# Patient Record
Sex: Male | Born: 1950 | Race: White | Hispanic: No
Health system: Southern US, Community
[De-identification: ages and names within clinical notes are randomized; demographics above are authoritative.]

## PROBLEM LIST (undated history)

## (undated) DIAGNOSIS — A4181 Sepsis due to Enterococcus: Secondary | ICD-10-CM

## (undated) DIAGNOSIS — N170 Acute kidney failure with tubular necrosis: Secondary | ICD-10-CM

## (undated) DIAGNOSIS — J9621 Acute and chronic respiratory failure with hypoxia: Secondary | ICD-10-CM

## (undated) DIAGNOSIS — I429 Cardiomyopathy, unspecified: Secondary | ICD-10-CM

## (undated) DIAGNOSIS — R6520 Severe sepsis without septic shock: Secondary | ICD-10-CM

## (undated) DIAGNOSIS — I509 Heart failure, unspecified: Secondary | ICD-10-CM

## (undated) DIAGNOSIS — I4892 Unspecified atrial flutter: Secondary | ICD-10-CM

---

## 2019-12-19 ENCOUNTER — Inpatient Hospital Stay
Admission: EM | Admit: 2019-12-19 | Discharge: 2020-02-08 | Disposition: A | Discharge status: Skilled Nursing Facility | Payer: No Typology Code available for payment source | Source: Other Acute Inpatient Hospital | Attending: Internal Medicine | Admitting: Internal Medicine

## 2019-12-19 ENCOUNTER — Other Ambulatory Visit (HOSPITAL_COMMUNITY): Payer: Medicare HMO

## 2019-12-19 DIAGNOSIS — I429 Cardiomyopathy, unspecified: Secondary | ICD-10-CM

## 2019-12-19 DIAGNOSIS — N170 Acute kidney failure with tubular necrosis: Secondary | ICD-10-CM | POA: Diagnosis present

## 2019-12-19 DIAGNOSIS — A4181 Sepsis due to Enterococcus: Secondary | ICD-10-CM | POA: Diagnosis present

## 2019-12-19 DIAGNOSIS — J9690 Respiratory failure, unspecified, unspecified whether with hypoxia or hypercapnia: Secondary | ICD-10-CM

## 2019-12-19 DIAGNOSIS — R6520 Severe sepsis without septic shock: Secondary | ICD-10-CM | POA: Diagnosis present

## 2019-12-19 DIAGNOSIS — R112 Nausea with vomiting, unspecified: Secondary | ICD-10-CM

## 2019-12-19 DIAGNOSIS — Z431 Encounter for attention to gastrostomy: Secondary | ICD-10-CM

## 2019-12-19 DIAGNOSIS — R188 Other ascites: Secondary | ICD-10-CM

## 2019-12-19 DIAGNOSIS — I4892 Unspecified atrial flutter: Secondary | ICD-10-CM | POA: Diagnosis present

## 2019-12-19 DIAGNOSIS — J90 Pleural effusion, not elsewhere classified: Secondary | ICD-10-CM

## 2019-12-19 DIAGNOSIS — J189 Pneumonia, unspecified organism: Secondary | ICD-10-CM

## 2019-12-19 DIAGNOSIS — R609 Edema, unspecified: Secondary | ICD-10-CM

## 2019-12-19 DIAGNOSIS — I509 Heart failure, unspecified: Secondary | ICD-10-CM

## 2019-12-19 DIAGNOSIS — Z930 Tracheostomy status: Secondary | ICD-10-CM

## 2019-12-19 DIAGNOSIS — J984 Other disorders of lung: Secondary | ICD-10-CM

## 2019-12-19 DIAGNOSIS — Z931 Gastrostomy status: Secondary | ICD-10-CM

## 2019-12-19 DIAGNOSIS — T8131XA Disruption of external operation (surgical) wound, not elsewhere classified, initial encounter: Secondary | ICD-10-CM

## 2019-12-19 DIAGNOSIS — J9621 Acute and chronic respiratory failure with hypoxia: Secondary | ICD-10-CM | POA: Diagnosis present

## 2019-12-19 HISTORY — DX: Severe sepsis without septic shock: A41.81

## 2019-12-19 HISTORY — DX: Cardiomyopathy, unspecified: I42.9

## 2019-12-19 HISTORY — DX: Cardiomyopathy, unspecified: I50.9

## 2019-12-19 HISTORY — DX: Severe sepsis without septic shock: R65.20

## 2019-12-19 HISTORY — DX: Acute kidney failure with tubular necrosis: N17.0

## 2019-12-19 HISTORY — DX: Unspecified atrial flutter: I48.92

## 2019-12-19 HISTORY — DX: Acute and chronic respiratory failure with hypoxia: J96.21

## 2019-12-20 ENCOUNTER — Other Ambulatory Visit (HOSPITAL_COMMUNITY): Payer: Medicare HMO

## 2019-12-20 DIAGNOSIS — R6520 Severe sepsis without septic shock: Secondary | ICD-10-CM

## 2019-12-20 DIAGNOSIS — I509 Heart failure, unspecified: Secondary | ICD-10-CM | POA: Diagnosis not present

## 2019-12-20 DIAGNOSIS — A4181 Sepsis due to Enterococcus: Secondary | ICD-10-CM

## 2019-12-20 DIAGNOSIS — N170 Acute kidney failure with tubular necrosis: Secondary | ICD-10-CM | POA: Diagnosis not present

## 2019-12-20 DIAGNOSIS — J9621 Acute and chronic respiratory failure with hypoxia: Secondary | ICD-10-CM

## 2019-12-20 DIAGNOSIS — Z930 Tracheostomy status: Secondary | ICD-10-CM

## 2019-12-20 DIAGNOSIS — I429 Cardiomyopathy, unspecified: Secondary | ICD-10-CM

## 2019-12-20 DIAGNOSIS — I4892 Unspecified atrial flutter: Secondary | ICD-10-CM

## 2019-12-20 LAB — URINALYSIS, ROUTINE W REFLEX MICROSCOPIC
Bilirubin Urine: NEGATIVE
Glucose, UA: NEGATIVE mg/dL
Ketones, ur: 5 mg/dL — AB
Leukocytes,Ua: NEGATIVE
Nitrite: NEGATIVE
Protein, ur: 30 mg/dL — AB
Specific Gravity, Urine: 1.032 — ABNORMAL HIGH (ref 1.005–1.030)
pH: 5 (ref 5.0–8.0)

## 2019-12-20 LAB — COMPREHENSIVE METABOLIC PANEL
ALT: 21 U/L (ref 0–44)
AST: 20 U/L (ref 15–41)
Albumin: 2.4 g/dL — ABNORMAL LOW (ref 3.5–5.0)
Alkaline Phosphatase: 61 U/L (ref 38–126)
Anion gap: 12 (ref 5–15)
BUN: 21 mg/dL (ref 8–23)
CO2: 33 mmol/L — ABNORMAL HIGH (ref 22–32)
Calcium: 9.4 mg/dL (ref 8.9–10.3)
Chloride: 103 mmol/L (ref 98–111)
Creatinine, Ser: 0.83 mg/dL (ref 0.61–1.24)
GFR calc Af Amer: >60 mL/min (ref >60)
GFR calc non Af Amer: >60 mL/min (ref >60)
Glucose, Bld: 139 mg/dL — ABNORMAL HIGH (ref 70–99)
Potassium: 4.3 mmol/L (ref 3.5–5.1)
Sodium: 148 mmol/L — ABNORMAL HIGH (ref 135–145)
Total Bilirubin: 0.7 mg/dL (ref 0.3–1.2)
Total Protein: 5.9 g/dL — ABNORMAL LOW (ref 6.5–8.1)

## 2019-12-20 LAB — CBC WITH DIFFERENTIAL/PLATELET
Abs Immature Granulocytes: 0.04 10*3/uL (ref 0.00–0.07)
Basophils Absolute: 0 10*3/uL (ref 0.0–0.1)
Basophils Relative: 0 %
Eosinophils Absolute: 0 10*3/uL (ref 0.0–0.5)
Eosinophils Relative: 1 %
HCT: 35.6 % — ABNORMAL LOW (ref 39.0–52.0)
Hemoglobin: 10.2 g/dL — ABNORMAL LOW (ref 13.0–17.0)
Immature Granulocytes: 1 %
Lymphocytes Relative: 9 %
Lymphs Abs: 0.8 10*3/uL (ref 0.7–4.0)
MCH: 28.3 pg (ref 26.0–34.0)
MCHC: 28.7 g/dL — ABNORMAL LOW (ref 30.0–36.0)
MCV: 98.9 fL (ref 80.0–100.0)
Monocytes Absolute: 0.7 10*3/uL (ref 0.1–1.0)
Monocytes Relative: 8 %
Neutro Abs: 7.1 10*3/uL (ref 1.7–7.7)
Neutrophils Relative %: 81 %
Platelets: 297 10*3/uL (ref 150–400)
RBC: 3.6 MIL/uL — ABNORMAL LOW (ref 4.22–5.81)
RDW: 15.1 % (ref 11.5–15.5)
WBC: 8.7 10*3/uL (ref 4.0–10.5)
nRBC: 0 % (ref 0.0–0.2)

## 2019-12-20 LAB — MAGNESIUM: Magnesium: 2.2 mg/dL (ref 1.7–2.4)

## 2019-12-20 LAB — TSH: TSH: 0.383 u[IU]/mL (ref 0.350–4.500)

## 2019-12-20 LAB — HEMOGLOBIN A1C
Hgb A1c MFr Bld: 6.5 % — ABNORMAL HIGH (ref 4.8–5.6)
Mean Plasma Glucose: 139.85 mg/dL

## 2019-12-20 LAB — PHOSPHORUS: Phosphorus: 2.5 mg/dL (ref 2.5–4.6)

## 2019-12-20 MED ORDER — IOHEXOL 300 MG/ML  SOLN
50.0000 mL | Freq: Once | INTRAMUSCULAR | Status: AC | PRN
  Administered 2019-12-20: 02:00:00 50 mL

## 2019-12-20 MED ORDER — IOHEXOL 300 MG/ML  SOLN
50.0000 mL | Freq: Once | INTRAMUSCULAR | Status: AC | PRN
  Administered 2019-12-20: 50 mL

## 2019-12-20 NOTE — Consult Note (Signed)
Pulmonary Critical Care Medicine [facility]  PULMONARY SERVICE  Date of Service: 12/20/2019  PULMONARY CRITICAL CARE CONSULT   Sean Porter  NAT:[id number]  DOB: 06-02-1951   DOA: 12/19/2019  Referring Physician: Carron Curie, MD  HPI: Sean Porter is a 69 y.o. male seen for follow up of Acute on Chronic Respiratory Failure.  Patient has multiple medical problems including congestive heart failure diabetes mellitus hypertension smoker who presented to the hospital because of a diagnosis of cholecystitis.  Patient was evaluated at that time was noted to have free air went for exploratory laparotomy and at that time was found to a gangrenous gallbladder.  Also patient had pancreatitis.  The pancreas was apparently necrotic.  Patient was in sepsis and shock and patient required pressors for this.  Patient was treated with vancomycin as well as meropenem.  Follow-up cultures were negative and he had a wound which grew Enterococcus faecalis.  Subsequently patient was treated with Zosyn.  Patient was not able to come off the ventilator eventually ended up with a PEG as well as a tracheostomy.  Now presents to our facility for further management and weaning at this time patient is on T collar  Review of Systems:  ROS performed and is unremarkable other than noted above.  Past medical history: CHF Diabetes mellitus Hypertension Smoker Respiratory failure Sepsis Pancreatitis Atrial flutter Cholecystitis CAD chest Acute renal failure  Past surgical history: Tracheostomy PEG Cholecystectomy Exploratory laparotomy  Social history: Positive for tobacco use Unknown alcohol or drug abuse  Family history: Noncontributory to the present illness  Medications: Reviewed on Rounds  Physical Exam:  Vitals: Temperature is 97.7 pulse 95 respiratory 22 blood pressure is 165/72 saturations 93%  Ventilator Settings off the ventilator right now on T collar  . General:  Comfortable at this time . Eyes: Grossly normal lids, irises & conjunctiva . ENT: grossly tongue is normal . Neck: no obvious mass . Cardiovascular: S1-S2 normal no gallop or rub . Respiratory: No rhonchi no rales are noted . Abdomen: Soft and nontender . Skin: no rash seen on limited exam . Musculoskeletal: not rigid . Psychiatric:unable to assess . Neurologic: no seizure no involuntary movements         Labs on Admission:  Basic Metabolic Panel: Recent Labs  Lab 12/20/19 0811  NA 148*  K 4.3  CL 103  CO2 33*  GLUCOSE 139*  BUN 21  CREATININE 0.83  CALCIUM 9.4  MG 2.2  PHOS 2.5    No results for input(s): PHART, PCO2ART, PO2ART, HCO3, O2SAT in the last 168 hours.  Liver Function Tests: Recent Labs  Lab 12/20/19 0811  AST 20  ALT 21  ALKPHOS 61  BILITOT 0.7  PROT 5.9*  ALBUMIN 2.4*   No results for input(s): LIPASE, AMYLASE in the last 168 hours. No results for input(s): AMMONIA in the last 168 hours.  CBC: Recent Labs  Lab 12/20/19 0811  WBC 8.7  NEUTROABS 7.1  HGB 10.2*  HCT 35.6*  MCV 98.9  PLT 297    Cardiac Enzymes: No results for input(s): CKTOTAL, CKMB, CKMBINDEX, TROPONINI in the last 168 hours.  BNP (last 3 results) No results for input(s): BNP in the last 8760 hours.  ProBNP (last 3 results) No results for input(s): PROBNP in the last 8760 hours.   Radiological Exams on Admission: DG ABDOMEN PEG TUBE LOCATION  Result Date: 12/20/2019 CLINICAL DATA:  Peg placement. EXAM: ABDOMEN - 1 VIEW COMPARISON:  None. FINDINGS: 00:16 hour: AP  portable supine image #1: Technologist injected 50 cc Omnipaque 300 through indwelling catheter. After injection, nurse stated that catheter that was injected was a drainage catheter rather than the gastric catheter. Contrast opacifies the catheter tubing and what appears to be the stomach. Additional catheter tubing is seen in the upper abdomen. Possible drain projects over the left lower quadrant as well.  This catheter is set to gravity drainage. 0208 hour: AP portable supine images # 2 and 3: Additional 50 cc Omnipaque 300 was injected through a different catheter which is more cranial, reportedly the PEG. Contrast from this injection opacifies the distal duodenum and proximal jejunum. Previous injected contrast has cleared. IMPRESSION: 1. Initial contrast injected in what was thought to be gastric tube was reportedly injected through a drainage catheter. This injection opacified would appears to be the stomach. Recommend correlation with patient's surgical and procedural history to identify what type of catheter or drain this is. 2. Second contrast injection through catheter more superiorly that was reportedly the PEG opacifies the distal duodenum and jejunum. No evidence of extravasation or leak. The previously injected contrast has apparently cleared. This catheter follows the course of the duodenum and jejunum, may represent a G-J tube. Correlation with clinical history again recommended. Electronically Signed   By: Keith Rake M.D.   On: 12/20/2019 02:41   DG Chest Port 1 View  Result Date: 12/20/2019 CLINICAL DATA:  Respiratory failure. Trach patient. EXAM: PORTABLE CHEST 1 VIEW COMPARISON:  None. FINDINGS: Endotracheal or tracheostomy tube tip 3.1 cm from the carina. Heart is enlarged. Heterogeneous bilateral lung opacities, most prominent in the bases. No pneumothorax or large pleural effusion. IMPRESSION: 1. Endotracheal or tracheostomy tube tip 3.1 cm from the carina. 2. Heterogeneous bilateral lung opacities, most prominent in the bases. Findings may be related to pneumonia including atypical viral organisms or pulmonary edema. 3. Cardiomegaly. Electronically Signed   By: Keith Rake M.D.   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 Acute on chronic respiratory failure with hypoxia right now patient is actually weaning well on the T collar with plan is going to be to continue with T collar trials as tolerated continue with secretion management and close pulmonary toilet. 2. Chronic congestive heart failure unspecified patient right now appears to be compensated advised chest x-ray was showing possible pulmonary edema along with the cardiomegaly.  Patient needs aggressive diuresis we will continue to monitor 3. Chronic atrial flutter rate now rate controlled we will continue to follow. 4. Severe sepsis this has resolved patient is apparently on Zosyn for a total of 28 days should be completing this soon 5. Acute renal failure this has resolved we will continue to monitor labs  I have personally seen and evaluated the patient, evaluated laboratory and imaging results, formulated the assessment and plan and placed orders. The Patient requires high complexity decision making with multiple systems involvement.  Case was discussed on Rounds with the Respiratory Therapy Director and the Respiratory staff Time Spent 65minutes  Jizel Cheeks A Jacorie Ernsberger, MD [facility] Pulmonary Critical Care Medicine Sleep Medicine

## 2019-12-21 ENCOUNTER — Other Ambulatory Visit (HOSPITAL_COMMUNITY): Payer: Medicare HMO

## 2019-12-21 ENCOUNTER — Encounter: Payer: Self-pay | Admitting: Internal Medicine

## 2019-12-21 DIAGNOSIS — N170 Acute kidney failure with tubular necrosis: Secondary | ICD-10-CM | POA: Diagnosis present

## 2019-12-21 DIAGNOSIS — J9621 Acute and chronic respiratory failure with hypoxia: Secondary | ICD-10-CM | POA: Diagnosis not present

## 2019-12-21 DIAGNOSIS — I4892 Unspecified atrial flutter: Secondary | ICD-10-CM | POA: Diagnosis not present

## 2019-12-21 DIAGNOSIS — I509 Heart failure, unspecified: Secondary | ICD-10-CM

## 2019-12-21 DIAGNOSIS — A4181 Sepsis due to Enterococcus: Secondary | ICD-10-CM | POA: Diagnosis present

## 2019-12-21 LAB — BASIC METABOLIC PANEL
Anion gap: 7 (ref 5–15)
BUN: 18 mg/dL (ref 8–23)
CO2: 33 mmol/L — ABNORMAL HIGH (ref 22–32)
Calcium: 9.2 mg/dL (ref 8.9–10.3)
Chloride: 105 mmol/L (ref 98–111)
Creatinine, Ser: 0.85 mg/dL (ref 0.61–1.24)
GFR calc Af Amer: >60 mL/min (ref >60)
GFR calc non Af Amer: >60 mL/min (ref >60)
Glucose, Bld: 210 mg/dL — ABNORMAL HIGH (ref 70–99)
Potassium: 4.1 mmol/L (ref 3.5–5.1)
Sodium: 145 mmol/L (ref 135–145)

## 2019-12-21 LAB — CBC
HCT: 34.7 % — ABNORMAL LOW (ref 39.0–52.0)
Hemoglobin: 10.4 g/dL — ABNORMAL LOW (ref 13.0–17.0)
MCH: 28.6 pg (ref 26.0–34.0)
MCHC: 30 g/dL (ref 30.0–36.0)
MCV: 95.3 fL (ref 80.0–100.0)
Platelets: 302 10*3/uL (ref 150–400)
RBC: 3.64 MIL/uL — ABNORMAL LOW (ref 4.22–5.81)
RDW: 15.2 % (ref 11.5–15.5)
WBC: 11.2 10*3/uL — ABNORMAL HIGH (ref 4.0–10.5)
nRBC: 0 % (ref 0.0–0.2)

## 2019-12-21 LAB — PHOSPHORUS: Phosphorus: 1.9 mg/dL — ABNORMAL LOW (ref 2.5–4.6)

## 2019-12-21 LAB — MAGNESIUM: Magnesium: 2.1 mg/dL (ref 1.7–2.4)

## 2019-12-21 NOTE — Consult Note (Signed)
Infectious Disease Consultation   Cristopher Ciccarelli  IPJ:[id number]  DOB: [birth year]  DOA: 12/19/2019  Requesting physician: Dr. Laren Everts  Reason for consultation: Antibiotic recommendations   History of Present Illness: Sean Porter is an 69 y.o. male with history of congestive heart failure, diabetes mellitus, hypertension, tobacco abuse who initially presented to [facility] on 11/19/2019 with abdominal pain.  Was noted to be hypotensive with free air noted on the abdominal imaging.  He had emergent exploratory laparotomy where he was found to have gangrenous cholecystitis with pancreatitis and infected pancreatic bed and necrosis.  He had the gallbladder removed and the necrotic pancreas debrided.  Initially he was in septic shock required pressors for shock and remained ventilated postoperatively.  On postoperative day 1 he developed atrial flutter and required amiodarone.  He was weaned from pressors on day 5 but intermittently required pressors after that.  He was treated with antibiotic therapy with meropenem and vancomycin for 5 days.  His blood cultures were negative but his wound showed Enterococcus faecalis and he was switched to Zosyn.  Previously he had 2 JP drains and an [place] drain with wound VAC.  He currently has midline abdominal dressing with drain in place. He underwent PEG tube and trach placement on 12/04/2019 for prolonged respiratory failure and need for nutrition.  Due to his complex medical problems he was transferred to [facility] for further management.  He was weaned from his TPN.  Tentative plan is to treat him with total 4 weeks of Zosyn.  He is nonverbal but able to nod when asked questions but seems confused.  Per the nursing staff he had 2 episodes of vomiting today.   Review of Systems:  He is nonverbal at this time.  Unable to obtain review of systems as the patient is confused.   Past Medical History: Past Medical History:  Diagnosis  Date  . Acute on chronic respiratory failure with hypoxia ([place])   . Acute renal failure due to tubular necrosis ([place])   . Chronic atrial flutter ([place])   . Congestive heart failure due to cardiomyopathy ([place])   . Severe sepsis with acute organ dysfunction due to Enterococcus species (HCC)   Diabetes mellitus, hypertension, coronary artery disease, acute renal failure  Past Surgical History: Tracheostomy, PEG tube placement, cholecystectomy, recent exploratory laparotomy  Allergies: No known drug allergies  Social History: History of tobacco abuse, unknown if history of alcohol abuse.  Family History: Unable to obtain at this time.   Physical Exam: Vitals: Temperature 97.5, pulse 105, respiratory rate 23, blood pressure 139/65, pulse oximetry 97% Constitutional: Ill-appearing male, awake, confused Head/Eyes: Atraumatic, normocephalic, PERLA  ENMT: external ears and nose appear normal, normal hearing, Lips appears normal, unable to visualize the oropharynx Neck: Has trach in place CVS: S1-S2, no murmur  Respiratory: Coarse breath sounds, rhonchi, no wheezing Abdomen: Distended, has dressing in place, abdominal drain in place, PEG tube, bowel sounds heard Musculoskeletal: No edema Neuro: He is confused, able to move his extremities but has severe debility with generalized weakness Psych: stable mood and affect, confused Skin: no rashes  Data reviewed:  I have personally reviewed following labs and imaging studies Labs:  CBC: Recent Labs  Lab 12/20/19 0811 12/21/19 0530  WBC 8.7 11.2*  NEUTROABS 7.1  --   HGB 10.2* 10.4*  HCT 35.6* 34.7*  MCV 98.9 95.3  PLT 297 734    Basic Metabolic Panel: Recent Labs  Lab 12/20/19 0811 12/21/19 0530  NA 148* 145  K 4.3 4.1  CL 103 105  CO2 33* 33*  GLUCOSE 139* 210*  BUN 21 18  CREATININE 0.83 0.85  CALCIUM 9.4 9.2  MG 2.2 2.1  PHOS 2.5 1.9*   GFR CrCl cannot be calculated (Unknown ideal weight.). Liver Function  Tests: Recent Labs  Lab 12/20/19 0811  AST 20  ALT 21  ALKPHOS 61  BILITOT 0.7  PROT 5.9*  ALBUMIN 2.4*   No results for input(s): LIPASE, AMYLASE in the last 168 hours. No results for input(s): AMMONIA in the last 168 hours. Coagulation profile No results for input(s): INR, PROTIME in the last 168 hours.  Cardiac Enzymes: No results for input(s): CKTOTAL, CKMB, CKMBINDEX, TROPONINI in the last 168 hours. BNP: Invalid input(s): POCBNP CBG: No results for input(s): GLUCAP in the last 168 hours. D-Dimer No results for input(s): DDIMER in the last 72 hours. Hgb A1c Recent Labs    12/20/19 0811  HGBA1C 6.5*   Lipid Profile No results for input(s): CHOL, HDL, LDLCALC, TRIG, CHOLHDL, LDLDIRECT in the last 72 hours. Thyroid function studies Recent Labs    12/20/19 0811  TSH 0.383   Anemia work up No results for input(s): VITAMINB12, FOLATE, FERRITIN, TIBC, IRON, RETICCTPCT in the last 72 hours. Urinalysis    Component Value Date/Time   COLORURINE YELLOW 12/20/2019 1820   APPEARANCEUR CLOUDY (A) 12/20/2019 1820   LABSPEC 1.032 (H) 12/20/2019 1820   PHURINE 5.0 12/20/2019 1820   GLUCOSEU NEGATIVE 12/20/2019 1820   HGBUR MODERATE (A) 12/20/2019 1820   BILIRUBINUR NEGATIVE 12/20/2019 1820   KETONESUR 5 (A) 12/20/2019 1820   PROTEINUR 30 (A) 12/20/2019 1820   NITRITE NEGATIVE 12/20/2019 1820   LEUKOCYTESUR NEGATIVE 12/20/2019 1820     Microbiology Recent Results (from the past 240 hour(s))  Culture, respiratory (non-expectorated)     Status: None (Preliminary result)   Collection Time: 12/20/19  9:44 AM   Specimen: Tracheal Aspirate; Respiratory  Result Value Ref Range Status   Specimen Description TRACHEAL ASPIRATE  Final   Special Requests NONE  Final   Gram Stain   Final    RARE WBC PRESENT, PREDOMINANTLY PMN FEW GRAM POSITIVE COCCI IN CLUSTERS FEW GRAM NEGATIVE RODS    Culture   Final    MODERATE STENOTROPHOMONAS MALTOPHILIA CULTURE REINCUBATED FOR  BETTER GROWTH Performed at [facility], [address] [place], [place] [address]    Report Status PENDING  Incomplete       Inpatient Medications:   Scheduled Meds: Continuous Infusions:   Radiological Exams on Admission: DG Abd 1 View  Result Date: 12/21/2019 CLINICAL DATA:  Nausea and vomiting. EXAM: ABDOMEN - 1 VIEW COMPARISON:  December 19, 2018 FINDINGS: There is a PEG tube projects over the left upper quadrant of the abdomen, unchanged since yesterday. No free air, portal venous gas, or pneumatosis or seen on limited supine imaging. No evidence of bowel obstruction. No other acute abnormalities. IMPRESSION: No acute abnormalities identified. Electronically Signed   By: Gerome Sam III M.D   On: 12/21/2019 14:52   DG ABDOMEN PEG TUBE LOCATION  Result Date: 12/20/2019 CLINICAL DATA:  Peg placement. EXAM: ABDOMEN - 1 VIEW COMPARISON:  None. FINDINGS: 00:16 hour: AP portable supine image #1: Technologist injected 50 cc Omnipaque 300 through indwelling catheter. After injection, nurse stated that catheter that was injected was a drainage catheter rather than the gastric catheter. Contrast opacifies the catheter tubing and what appears to be the stomach. Additional catheter tubing is seen in the  upper abdomen. Possible drain projects over the left lower quadrant as well. This catheter is set to gravity drainage. 0208 hour: AP portable supine images # 2 and 3: Additional 50 cc Omnipaque 300 was injected through a different catheter which is more cranial, reportedly the PEG. Contrast from this injection opacifies the distal duodenum and proximal jejunum. Previous injected contrast has cleared. IMPRESSION: 1. Initial contrast injected in what was thought to be gastric tube was reportedly injected through a drainage catheter. This injection opacified would appears to be the stomach. Recommend correlation with patient's surgical and procedural history to identify what type of catheter or  drain this is. 2. Second contrast injection through catheter more superiorly that was reportedly the PEG opacifies the distal duodenum and jejunum. No evidence of extravasation or leak. The previously injected contrast has apparently cleared. This catheter follows the course of the duodenum and jejunum, may represent a G-J tube. Correlation with clinical history again recommended. Electronically Signed   By: Narda Rutherford M.D.   On: 12/20/2019 02:41   DG CHEST PORT 1 VIEW  Result Date: 12/21/2019 CLINICAL DATA:  Pneumonia in tracheostomy tube EXAM: PORTABLE CHEST 1 VIEW COMPARISON:  Yesterday FINDINGS: Tracheostomy tube is present and well seated. Low volume chest with indistinct infiltrates at the bases. Lung volumes are mildly improved. Vague rounded density over the right lower lung may relate to the anterior fourth rib. Attention on follow-up. Cardiomegaly. IMPRESSION: 1. Well seated tracheostomy tube. 2. Mildly improved lung volumes but unchanged basilar infiltrates. Electronically Signed   By: Marnee Spring M.D.   On: 12/21/2019 07:09   DG Chest Port 1 View  Result Date: 12/20/2019 CLINICAL DATA:  Respiratory failure. Trach patient. EXAM: PORTABLE CHEST 1 VIEW COMPARISON:  None. FINDINGS: Endotracheal or tracheostomy tube tip 3.1 cm from the carina. Heart is enlarged. Heterogeneous bilateral lung opacities, most prominent in the bases. No pneumothorax or large pleural effusion. IMPRESSION: 1. Endotracheal or tracheostomy tube tip 3.1 cm from the carina. 2. Heterogeneous bilateral lung opacities, most prominent in the bases. Findings may be related to pneumonia including atypical viral organisms or pulmonary edema. 3. Cardiomegaly. Electronically Signed   By: Narda Rutherford M.D.   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  Postoperatively he had respiratory failure.  Currently has trach in place.  On 28% FiO2.  He is still having increased secretions.  He is at high risk for aspiration and worsening kidney failure secondary to aspiration pneumonia.  Currently on Zosyn for necrotic pancreatitis secondary to ruptured gangrenous gallbladder.  If his respiratory status worsens would recommend repeat respiratory imaging preferably chest CT without contrast to better evaluate and also send for respiratory cultures.  Follow-up on the cultures and treat accordingly.  Continue aggressive pulmonary toileting.  Severe sepsis: Patient previously had septic shock at the outside facility secondary to ruptured gallbladder from gangrenous cholecystitis and pancreatic necrosis.  Currently on IV Zosyn.  Cultures showed Enterococcus species.  Tentative plan is to treat for total of  28 days.  He has 4 more days to complete the duration of 4 weeks of treatment.  However, he is at very high risk for recurrent sepsis.  If he starts having any worsening fever, leukocytosis would recommend to send for repeat pancultures and also repeat abdominal imaging to evaluate.  Gangrenous cholecystitis with pancreatic necrosis: Status post expiratory laparotomy with drains in place.  Continue IV antibiotics as mentioned above.  Continue local wound care.  He has a postoperative abdominal wound with dressing in place.  Dysphagia: Due to his dysphagia he is high risk for aspiration and worsening respiratory failure  secondary to aspiration pneumonia.  Vomiting: Per the nursing staff he had 2 episodes of vomiting.  Does not have much residual on his tube feeds.  KUB at this time did not show any acute findings.  However, he is very high risk for ileus.  If he continues to have vomiting would recommend to hold the tube feeds and repeat abdominal imaging.  Further management per the primary team.  Diabetes mellitus type 2: Blood glucose on the higher side.  Continue to monitor Accu-Cheks, further management of diabetes per the primary team.  Chronically malnutrition: On tube feeds.  Further management per primary team.  Congestive heart failure: At this time appears to be compensated.  Continue medications and management per primary team.  However, he is very high risk for decompensated CHF.  Monitor closely.  Chronic atrial flutter: Continue medications per primary team.  Currently rate controlled.  Acute renal failure: Patient at the outside facility also had acute renal failure.  At this time renal function appears to be stable.  However, he is high risk for worsening renal failure.  Continue to monitor BUN/creatinine closely especially while on antibiotics and adjust dose accordingly.  Avoid nephrotoxic medication.  Due to his complex medical problems he is high risk for worsening and decompensation.  Plan of care discussed with the primary team.  Thank you for this consultation.    Vonzella Nipple M.D. 12/21/2019, 5:38 PM

## 2019-12-21 NOTE — Progress Notes (Signed)
Pulmonary Critical Care Medicine [facility] [place]   PULMONARY CRITICAL CARE SERVICE  PROGRESS NOTE  Date of Service: 12/21/2019  Sean Porter  ACZ:[id number]  DOB: July 01, 1951   DOA: 12/19/2019  Referring Physician: Carron Curie, MD  HPI: Sean Porter is a 69 y.o. male seen for follow up of Acute on Chronic Respiratory Failure.  Patient is on T collar today has thick copious amounts of secretions still.  Right now is requiring 28% FiO2 and frequent suctioning  Medications: Reviewed on Rounds  Physical Exam:  Vitals: Temperature is 98.1 pulse 95 respiratory 24 blood pressure is 151/69 saturations 94%  Ventilator Settings on T collar with an FiO2 28%  . General: Comfortable at this time . Eyes: Grossly normal lids, irises & conjunctiva . ENT: grossly tongue is normal . Neck: no obvious mass . Cardiovascular: S1 S2 normal no gallop . Respiratory: Coarse breath sounds with a few scattered rhonchi . Abdomen: soft . Skin: no rash seen on limited exam . Musculoskeletal: not rigid . Psychiatric:unable to assess . Neurologic: no seizure no involuntary movements         Lab Data:   Basic Metabolic Panel: Recent Labs  Lab 12/20/19 0811 12/21/19 0530  NA 148* 145  K 4.3 4.1  CL 103 105  CO2 33* 33*  GLUCOSE 139* 210*  BUN 21 18  CREATININE 0.83 0.85  CALCIUM 9.4 9.2  MG 2.2 2.1  PHOS 2.5 1.9*    ABG: No results for input(s): PHART, PCO2ART, PO2ART, HCO3, O2SAT in the last 168 hours.  Liver Function Tests: Recent Labs  Lab 12/20/19 0811  AST 20  ALT 21  ALKPHOS 61  BILITOT 0.7  PROT 5.9*  ALBUMIN 2.4*   No results for input(s): LIPASE, AMYLASE in the last 168 hours. No results for input(s): AMMONIA in the last 168 hours.  CBC: Recent Labs  Lab 12/20/19 0811 12/21/19 0530  WBC 8.7 11.2*  NEUTROABS 7.1  --   HGB 10.2* 10.4*  HCT 35.6* 34.7*  MCV 98.9 95.3  PLT 297 302    Cardiac Enzymes: No results for input(s): CKTOTAL, CKMB,  CKMBINDEX, TROPONINI in the last 168 hours.  BNP (last 3 results) No results for input(s): BNP in the last 8760 hours.  ProBNP (last 3 results) No results for input(s): PROBNP in the last 8760 hours.  Radiological Exams: DG ABDOMEN PEG TUBE LOCATION  Result Date: 12/20/2019 CLINICAL DATA:  Peg placement. EXAM: ABDOMEN - 1 VIEW COMPARISON:  None. FINDINGS: 00:16 hour: AP portable supine image #1: Technologist injected 50 cc Omnipaque 300 through indwelling catheter. After injection, nurse stated that catheter that was injected was a drainage catheter rather than the gastric catheter. Contrast opacifies the catheter tubing and what appears to be the stomach. Additional catheter tubing is seen in the upper abdomen. Possible drain projects over the left lower quadrant as well. This catheter is set to gravity drainage. 0208 hour: AP portable supine images # 2 and 3: Additional 50 cc Omnipaque 300 was injected through a different catheter which is more cranial, reportedly the PEG. Contrast from this injection opacifies the distal duodenum and proximal jejunum. Previous injected contrast has cleared. IMPRESSION: 1. Initial contrast injected in what was thought to be gastric tube was reportedly injected through a drainage catheter. This injection opacified would appears to be the stomach. Recommend correlation with patient's surgical and procedural history to identify what type of catheter or drain this is. 2. Second contrast injection through catheter more superiorly that  was reportedly the PEG opacifies the distal duodenum and jejunum. No evidence of extravasation or leak. The previously injected contrast has apparently cleared. This catheter follows the course of the duodenum and jejunum, may represent a G-J tube. Correlation with clinical history again recommended. Electronically Signed   By: Keith Rake M.D.   On: 12/20/2019 02:41   DG CHEST PORT 1 VIEW  Result Date: 12/21/2019 CLINICAL DATA:   Pneumonia in tracheostomy tube EXAM: PORTABLE CHEST 1 VIEW COMPARISON:  Yesterday FINDINGS: Tracheostomy tube is present and well seated. Low volume chest with indistinct infiltrates at the bases. Lung volumes are mildly improved. Vague rounded density over the right lower lung may relate to the anterior fourth rib. Attention on follow-up. Cardiomegaly. IMPRESSION: 1. Well seated tracheostomy tube. 2. Mildly improved lung volumes but unchanged basilar infiltrates. Electronically Signed   By: Monte Fantasia M.D.   On: 12/21/2019 07:09   DG Chest Port 1 View  Result Date: 12/20/2019 CLINICAL DATA:  Respiratory failure. Trach patient. EXAM: PORTABLE CHEST 1 VIEW COMPARISON:  None. FINDINGS: Endotracheal or tracheostomy tube tip 3.1 cm from the carina. Heart is enlarged. Heterogeneous bilateral lung opacities, most prominent in the bases. No pneumothorax or large pleural effusion. IMPRESSION: 1. Endotracheal or tracheostomy tube tip 3.1 cm from the carina. 2. Heterogeneous bilateral lung opacities, most prominent in the bases. Findings may be related to pneumonia including atypical viral organisms or pulmonary edema. 3. Cardiomegaly. Electronically Signed   By: Keith Rake M.D.   On: 12/20/2019 01:15    Assessment/Plan Active Problems:   Acute on chronic respiratory failure with hypoxia (HCC)   Congestive heart failure due to cardiomyopathy (HCC)   Chronic atrial flutter (HCC)   Severe sepsis with acute organ dysfunction due to Enterococcus species ([place])   Acute renal failure due to tubular necrosis ([employer])   1. Acute on chronic respiratory failure with hypoxia continue with T collar for now secretions are limiting [place] as far as being able to proceed more aggressively 2. Congestive heart failure due to cardiomyopathy appears to be compensated diuretics as tolerated 3. Chronic atrial flutter rate controlled 4. Severe sepsis resolved 5. Acute renal failure resolved we will continue to monitor labs  closely.   I have personally seen and evaluated the patient, evaluated laboratory and imaging results, formulated the assessment and plan and placed orders. The Patient requires high complexity decision making with multiple systems involvement.  Rounds were done with the Respiratory Therapy Director and Staff therapists and discussed with nursing staff also.  Time 35 minutes  Allyne Gee, MD [facility] Pulmonary Critical Care Medicine Sleep Medicine

## 2019-12-22 ENCOUNTER — Other Ambulatory Visit (HOSPITAL_COMMUNITY): Payer: Medicare HMO

## 2019-12-22 DIAGNOSIS — I4892 Unspecified atrial flutter: Secondary | ICD-10-CM | POA: Diagnosis not present

## 2019-12-22 DIAGNOSIS — N170 Acute kidney failure with tubular necrosis: Secondary | ICD-10-CM | POA: Diagnosis not present

## 2019-12-22 DIAGNOSIS — J9621 Acute and chronic respiratory failure with hypoxia: Secondary | ICD-10-CM | POA: Diagnosis not present

## 2019-12-22 DIAGNOSIS — I509 Heart failure, unspecified: Secondary | ICD-10-CM | POA: Diagnosis not present

## 2019-12-22 NOTE — Progress Notes (Addendum)
Pulmonary Critical Care Medicine [place]   PULMONARY CRITICAL CARE SERVICE  PROGRESS NOTE  Date of Service: 12/22/2019  Sean Porter  DXI:[id number]  DOB: June 26, 1951   DOA: 12/19/2019  Referring Physician: Merton Border, MD  HPI: Sean Porter is a 69 y.o. male seen for follow up of Acute on Chronic Respiratory Failure.  Patient is on T collar has been having copious thick amount of secretions.  Patient has been requiring frequent suctioning so been holding off on capping right now  Medications: Reviewed on Rounds  Physical Exam:  Vitals: Temperature is 98.0 pulse 86 respiratory 18 blood pressure is 141/76 saturations 97%  Ventilator Settings on T collar with an FiO2 28%  . General: Comfortable at this time . Eyes: Grossly normal lids, irises & conjunctiva . ENT: grossly tongue is normal . Neck: no obvious mass . Cardiovascular: S1 S2 normal no gallop . Respiratory: Scattered rhonchi expansion is equal at this time . Abdomen: soft . Skin: no rash seen on limited exam . Musculoskeletal: not rigid . Psychiatric:unable to assess . Neurologic: no seizure no involuntary movements         Lab Data:   Basic Metabolic Panel: Recent Labs  Lab 12/20/19 0811 12/21/19 0530  NA 148* 145  K 4.3 4.1  CL 103 105  CO2 33* 33*  GLUCOSE 139* 210*  BUN 21 18  CREATININE 0.83 0.85  CALCIUM 9.4 9.2  MG 2.2 2.1  PHOS 2.5 1.9*    ABG: No results for input(s): PHART, PCO2ART, PO2ART, HCO3, O2SAT in the last 168 hours.  Liver Function Tests: Recent Labs  Lab 12/20/19 0811  AST 20  ALT 21  ALKPHOS 61  BILITOT 0.7  PROT 5.9*  ALBUMIN 2.4*   No results for input(s): LIPASE, AMYLASE in the last 168 hours. No results for input(s): AMMONIA in the last 168 hours.  CBC: Recent Labs  Lab 12/20/19 0811 12/21/19 0530  WBC 8.7 11.2*  NEUTROABS 7.1  --   HGB 10.2* 10.4*  HCT 35.6* 34.7*  MCV 98.9 95.3  PLT 297 302    Cardiac Enzymes: No results for  input(s): CKTOTAL, CKMB, CKMBINDEX, TROPONINI in the last 168 hours.  BNP (last 3 results) No results for input(s): BNP in the last 8760 hours.  ProBNP (last 3 results) No results for input(s): PROBNP in the last 8760 hours.  Radiological Exams: DG Abd 1 View  Result Date: 12/21/2019 CLINICAL DATA:  Nausea and vomiting. EXAM: ABDOMEN - 1 VIEW COMPARISON:  December 19, 2018 FINDINGS: There is a PEG tube projects over the left upper quadrant of the abdomen, unchanged since yesterday. No free air, portal venous gas, or pneumatosis or seen on limited supine imaging. No evidence of bowel obstruction. No other acute abnormalities. IMPRESSION: No acute abnormalities identified. Electronically Signed   By: Dorise Bullion III M.D   On: 12/21/2019 14:52   DG CHEST PORT 1 VIEW  Result Date: 12/21/2019 CLINICAL DATA:  Pneumonia in tracheostomy tube EXAM: PORTABLE CHEST 1 VIEW COMPARISON:  Yesterday FINDINGS: Tracheostomy tube is present and well seated. Low volume chest with indistinct infiltrates at the bases. Lung volumes are mildly improved. Vague rounded density over the right lower lung may relate to the anterior fourth rib. Attention on follow-up. Cardiomegaly. IMPRESSION: 1. Well seated tracheostomy tube. 2. Mildly improved lung volumes but unchanged basilar infiltrates. Electronically Signed   By: Monte Fantasia M.D.   On: 12/21/2019 07:09    Assessment/Plan Active Problems:   Acute  on chronic respiratory failure with hypoxia (HCC)   Congestive heart failure due to cardiomyopathy (HCC)   Chronic atrial flutter (HCC)   Severe sepsis with acute organ dysfunction due to Enterococcus species (HCC)   Acute renal failure due to tubular necrosis (HCC)   1. Acute on chronic respiratory failure with hypoxia plan is to continue with T collar trials titrate oxygen continue pulmonary toilet.  Patient is supposed to have a tracheostomy changed out today hopefully this will help with secretions  also 2. Chronic atrial flutter rate is controlled we will continue with supportive care 3. Severe sepsis patient is on management per infectious disease 4. Congestive heart failure right now is compensated we will continue to monitor. 5. Acute renal failure following labs closely   I have personally seen and evaluated the patient, evaluated laboratory and imaging results, formulated the assessment and plan and placed orders. The Patient requires high complexity decision making with multiple systems involvement.  Rounds were done with the Respiratory Therapy Director and Staff therapists and discussed with nursing staff also.  Time 35 minutes  Yevonne Pax, MD [facility] Pulmonary Critical Care Medicine Sleep Medicine

## 2019-12-22 NOTE — Progress Notes (Signed)
  Request for drain placement and replace GJ tube.  Images reviewed by Dr. Miles Costain.  No need for drain placement at this time.  Will exchange GJ on Monday.  Ok to use J portion of tube. Do NOT use G portion of tube.  Jerry Caras Jarica Plass PA-C 12/22/2019 3:42 PM

## 2019-12-23 DIAGNOSIS — I4892 Unspecified atrial flutter: Secondary | ICD-10-CM | POA: Diagnosis not present

## 2019-12-23 DIAGNOSIS — N170 Acute kidney failure with tubular necrosis: Secondary | ICD-10-CM | POA: Diagnosis not present

## 2019-12-23 DIAGNOSIS — I509 Heart failure, unspecified: Secondary | ICD-10-CM | POA: Diagnosis not present

## 2019-12-23 DIAGNOSIS — J9621 Acute and chronic respiratory failure with hypoxia: Secondary | ICD-10-CM | POA: Diagnosis not present

## 2019-12-23 LAB — CULTURE, RESPIRATORY W GRAM STAIN

## 2019-12-23 NOTE — Progress Notes (Addendum)
Pulmonary Critical Care Medicine [facility] [place]   PULMONARY CRITICAL CARE SERVICE  PROGRESS NOTE  Date of Service: 12/23/2019  Sean Porter  HYI:[id number]  DOB: Jul 06, 1951   DOA: 12/19/2019  Referring Physician: Carron Curie, MD  HPI: Sean Porter is a 69 y.o. male seen for follow up of Acute on Chronic Respiratory Failure.  Patient continues on 20% aerosol trach collar satting well no fever or distress.  Medications: Reviewed on Rounds  Physical Exam:  Vitals: Pulse 85 respirations 20 BP 149/71 O2 sat 99% temp 96.8  Ventilator Settings 28% ATC  . General: Comfortable at this time . Eyes: Grossly normal lids, irises & conjunctiva . ENT: grossly tongue is normal . Neck: no obvious mass . Cardiovascular: S1 S2 normal no gallop . Respiratory: No rales or rhonchi noted . Abdomen: soft . Skin: no rash seen on limited exam . Musculoskeletal: not rigid . Psychiatric:unable to assess . Neurologic: no seizure no involuntary movements         Lab Data:   Basic Metabolic Panel: Recent Labs  Lab 12/20/19 0811 12/21/19 0530  NA 148* 145  K 4.3 4.1  CL 103 105  CO2 33* 33*  GLUCOSE 139* 210*  BUN 21 18  CREATININE 0.83 0.85  CALCIUM 9.4 9.2  MG 2.2 2.1  PHOS 2.5 1.9*    ABG: No results for input(s): PHART, PCO2ART, PO2ART, HCO3, O2SAT in the last 168 hours.  Liver Function Tests: Recent Labs  Lab 12/20/19 0811  AST 20  ALT 21  ALKPHOS 61  BILITOT 0.7  PROT 5.9*  ALBUMIN 2.4*   No results for input(s): LIPASE, AMYLASE in the last 168 hours. No results for input(s): AMMONIA in the last 168 hours.  CBC: Recent Labs  Lab 12/20/19 0811 12/21/19 0530  WBC 8.7 11.2*  NEUTROABS 7.1  --   HGB 10.2* 10.4*  HCT 35.6* 34.7*  MCV 98.9 95.3  PLT 297 302    Cardiac Enzymes: No results for input(s): CKTOTAL, CKMB, CKMBINDEX, TROPONINI in the last 168 hours.  BNP (last 3 results) No results for input(s): BNP in the last 8760 hours.  ProBNP  (last 3 results) No results for input(s): PROBNP in the last 8760 hours.  Radiological Exams: CT ABDOMEN PELVIS WO CONTRAST  Result Date: 12/22/2019 CLINICAL DATA:  Patient status post exploratory laparotomy and cholecystectomy for gangrenous cholecystitis. Pancreatic necrosis. The patient's surgical drain fell out. Question abscess. EXAM: CT ABDOMEN AND PELVIS WITHOUT CONTRAST TECHNIQUE: Multidetector CT imaging of the abdomen and pelvis was performed following the standard protocol without IV contrast. COMPARISON:  None. FINDINGS: Lower chest: Small right pleural effusion and mild bibasilar atelectasis. Heart size is upper normal. No pericardial effusion. Hepatobiliary: Status post cholecystectomy. The liver and biliary tree appear normal. Pancreas: Head and neck of the pancreas appear edematous. The pancreas is otherwise unremarkable. Spleen: Normal in size without focal abnormality. Adrenals/Urinary Tract: Adrenal glands are unremarkable. Kidneys are normal, without renal calculi, focal lesion, or hydronephrosis. Bladder is unremarkable. Stomach/Bowel: Stomach is within normal limits. Appendix appears normal. No evidence of bowel wall thickening, distention, or inflammatory changes. Percutaneous gastrojejunostomy tube is in place. The balloon on the tube is between the anterior abdominal wall and the anterior wall of the stomach. Vascular/Lymphatic: No significant vascular findings are present. No enlarged abdominal or pelvic lymph nodes. Reproductive: Prostate is unremarkable. Other: A small volume of scattered abdominal ascites is identified. There is extensive edema about the head and neck of the pancreas. A few  small locules of free air are seen in the mesentery anterior to the pancreas. There is also a small amount of free air along the medial border of the liver. An ill-defined fluid collection anterior to the right psoas muscle measures 9.6 cm craniocaudal by up to 5.3 cm transverse by 3.6 cm AP.  This collection is adjacent to a high attenuating collection consistent with oral contrast in the central mesentery measuring 2.6 cm AP by 3.7 cm transverse by 5.4 cm craniocaudal. Musculoskeletal: No acute or focal bony abnormality is identified. Multilevel thoracic and lumbar spondylosis is noted. IMPRESSION: Evaluation for abscess is markedly limited due to the lack of oral or IV contrast material. A fluid anterior to the right psoas is seen. Given the patient's recent surgery and history pancreatitis, it could be due to seroma, pseudocyst or abscess. High attenuating collection in the central mesentery is consistent with contrast related to injection of the patient's jejunostomy tube from 12/20/2019. Source of the leak is not seen. The balloon for the patient's gastrojejunostomy tube is outside the stomach between the anterior wall of the stomach and abdominal wall. Findings consistent with pancreatitis. Atherosclerosis. Small right pleural effusion. Electronically Signed   By: Inge Rise M.D.   On: 12/22/2019 13:22    Assessment/Plan Active Problems:   Acute on chronic respiratory failure with hypoxia (HCC)   Congestive heart failure due to cardiomyopathy (HCC)   Chronic atrial flutter (HCC)   Severe sepsis with acute organ dysfunction due to Enterococcus species ([place])   Acute renal failure due to tubular necrosis ([place])   1. Acute on chronic respiratory failure with hypoxia continue to wean on aerosol trach collar.  Continue supportive measures and pulmonary toilet. 2. Chronic atrial flutter rate is controlled we will continue with supportive care 3. Severe sepsis patient is on management per infectious disease 4. Congestive heart failure right now is compensated we will continue to monitor. 5. Acute renal failure following labs closely   I have personally seen and evaluated the patient, evaluated laboratory and imaging results, formulated the assessment and plan and placed orders. The  Patient requires high complexity decision making with multiple systems involvement.  Rounds were done with the Respiratory Therapy Director and Staff therapists and discussed with nursing staff also.  Allyne Gee, MD [facility] Pulmonary Critical Care Medicine Sleep Medicine

## 2019-12-24 DIAGNOSIS — I509 Heart failure, unspecified: Secondary | ICD-10-CM | POA: Diagnosis not present

## 2019-12-24 DIAGNOSIS — J9621 Acute and chronic respiratory failure with hypoxia: Secondary | ICD-10-CM | POA: Diagnosis not present

## 2019-12-24 DIAGNOSIS — I4892 Unspecified atrial flutter: Secondary | ICD-10-CM | POA: Diagnosis not present

## 2019-12-24 DIAGNOSIS — N170 Acute kidney failure with tubular necrosis: Secondary | ICD-10-CM | POA: Diagnosis not present

## 2019-12-24 LAB — BASIC METABOLIC PANEL
Anion gap: 8 (ref 5–15)
BUN: 20 mg/dL (ref 8–23)
CO2: 32 mmol/L (ref 22–32)
Calcium: 9.3 mg/dL (ref 8.9–10.3)
Chloride: 107 mmol/L (ref 98–111)
Creatinine, Ser: 0.74 mg/dL (ref 0.61–1.24)
GFR calc Af Amer: >60 mL/min (ref >60)
GFR calc non Af Amer: >60 mL/min (ref >60)
Glucose, Bld: 145 mg/dL — ABNORMAL HIGH (ref 70–99)
Potassium: 4.3 mmol/L (ref 3.5–5.1)
Sodium: 147 mmol/L — ABNORMAL HIGH (ref 135–145)

## 2019-12-24 LAB — CBC
HCT: 35.8 % — ABNORMAL LOW (ref 39.0–52.0)
Hemoglobin: 10.4 g/dL — ABNORMAL LOW (ref 13.0–17.0)
MCH: 28.3 pg (ref 26.0–34.0)
MCHC: 29.1 g/dL — ABNORMAL LOW (ref 30.0–36.0)
MCV: 97.3 fL (ref 80.0–100.0)
Platelets: 266 10*3/uL (ref 150–400)
RBC: 3.68 MIL/uL — ABNORMAL LOW (ref 4.22–5.81)
RDW: 15.3 % (ref 11.5–15.5)
WBC: 6.6 10*3/uL (ref 4.0–10.5)
nRBC: 0 % (ref 0.0–0.2)

## 2019-12-24 LAB — MAGNESIUM: Magnesium: 2.2 mg/dL (ref 1.7–2.4)

## 2019-12-24 LAB — PHOSPHORUS: Phosphorus: 2.9 mg/dL (ref 2.5–4.6)

## 2019-12-24 NOTE — Progress Notes (Addendum)
Pulmonary Critical Care Medicine [facility] [place]   PULMONARY CRITICAL CARE SERVICE  PROGRESS NOTE  Date of Service: 12/24/2019  Sean Porter  JSE:[id number]  DOB: 12-21-1950   DOA: 12/19/2019  Referring Physician: Carron Curie, MD  HPI: Sean Porter is a 69 y.o. male seen for follow up of Acute on Chronic Respiratory Failure.  Patient continues on aerosol trach collar 28% FiO2 satting well no fever distress.  Medications: Reviewed on Rounds  Physical Exam:  Vitals: Pulse 73 respirations 18 BP 125/79 O2 sat 98% temp 97.2  Ventilator Settings 28% ATC  . General: Comfortable at this time . Eyes: Grossly normal lids, irises & conjunctiva . ENT: grossly tongue is normal . Neck: no obvious mass . Cardiovascular: S1 S2 normal no gallop . Respiratory: No rales or rhonchi noted . Abdomen: soft . Skin: no rash seen on limited exam . Musculoskeletal: not rigid . Psychiatric:unable to assess . Neurologic: no seizure no involuntary movements         Lab Data:   Basic Metabolic Panel: Recent Labs  Lab 12/20/19 0811 12/21/19 0530 12/24/19 0444  NA 148* 145 147*  K 4.3 4.1 4.3  CL 103 105 107  CO2 33* 33* 32  GLUCOSE 139* 210* 145*  BUN 21 18 20   CREATININE 0.83 0.85 0.74  CALCIUM 9.4 9.2 9.3  MG 2.2 2.1 2.2  PHOS 2.5 1.9* 2.9    ABG: No results for input(s): PHART, PCO2ART, PO2ART, HCO3, O2SAT in the last 168 hours.  Liver Function Tests: Recent Labs  Lab 12/20/19 0811  AST 20  ALT 21  ALKPHOS 61  BILITOT 0.7  PROT 5.9*  ALBUMIN 2.4*   No results for input(s): LIPASE, AMYLASE in the last 168 hours. No results for input(s): AMMONIA in the last 168 hours.  CBC: Recent Labs  Lab 12/20/19 0811 12/21/19 0530 12/24/19 0444  WBC 8.7 11.2* 6.6  NEUTROABS 7.1  --   --   HGB 10.2* 10.4* 10.4*  HCT 35.6* 34.7* 35.8*  MCV 98.9 95.3 97.3  PLT 297 302 266    Cardiac Enzymes: No results for input(s): CKTOTAL, CKMB, CKMBINDEX, TROPONINI in the  last 168 hours.  BNP (last 3 results) No results for input(s): BNP in the last 8760 hours.  ProBNP (last 3 results) No results for input(s): PROBNP in the last 8760 hours.  Radiological Exams: No results found.  Assessment/Plan Active Problems:   Acute on chronic respiratory failure with hypoxia (HCC)   Congestive heart failure due to cardiomyopathy (HCC)   Chronic atrial flutter (HCC)   Severe sepsis with acute organ dysfunction due to Enterococcus species (HCC)   Acute renal failure due to tubular necrosis (HCC)   1. Acute on chronic respiratory failure with hypoxia continue to wean on trach collar.  Continue supportive measures and pulmonary toilet. 2. Chronic atrial flutter rate is controlled we will continue with supportive care 3. Severe sepsis patient is on management per infectious disease 4. Congestive heart failure right now is compensated we will continue to monitor. 5. Acute renal failure following labs closely   I have personally seen and evaluated the patient, evaluated laboratory and imaging results, formulated the assessment and plan and placed orders. The Patient requires high complexity decision making with multiple systems involvement.  Rounds were done with the Respiratory Therapy Director and Staff therapists and discussed with nursing staff also.  12/26/19, MD [facility] Pulmonary Critical Care Medicine Sleep Medicine

## 2019-12-25 ENCOUNTER — Other Ambulatory Visit (HOSPITAL_COMMUNITY): Payer: Medicare HMO

## 2019-12-25 DIAGNOSIS — I4892 Unspecified atrial flutter: Secondary | ICD-10-CM | POA: Diagnosis not present

## 2019-12-25 DIAGNOSIS — J9621 Acute and chronic respiratory failure with hypoxia: Secondary | ICD-10-CM | POA: Diagnosis not present

## 2019-12-25 DIAGNOSIS — N170 Acute kidney failure with tubular necrosis: Secondary | ICD-10-CM | POA: Diagnosis not present

## 2019-12-25 DIAGNOSIS — I509 Heart failure, unspecified: Secondary | ICD-10-CM | POA: Diagnosis not present

## 2019-12-25 HISTORY — PX: IR GJ TUBE CHANGE: IMG1440

## 2019-12-25 LAB — CBC
HCT: 36.2 % — ABNORMAL LOW (ref 39.0–52.0)
Hemoglobin: 10.6 g/dL — ABNORMAL LOW (ref 13.0–17.0)
MCH: 28.3 pg (ref 26.0–34.0)
MCHC: 29.3 g/dL — ABNORMAL LOW (ref 30.0–36.0)
MCV: 96.8 fL (ref 80.0–100.0)
Platelets: 249 10*3/uL (ref 150–400)
RBC: 3.74 MIL/uL — ABNORMAL LOW (ref 4.22–5.81)
RDW: 15.2 % (ref 11.5–15.5)
WBC: 7.3 10*3/uL (ref 4.0–10.5)
nRBC: 0 % (ref 0.0–0.2)

## 2019-12-25 LAB — BASIC METABOLIC PANEL
Anion gap: 9 (ref 5–15)
BUN: 19 mg/dL (ref 8–23)
CO2: 31 mmol/L (ref 22–32)
Calcium: 9.6 mg/dL (ref 8.9–10.3)
Chloride: 103 mmol/L (ref 98–111)
Creatinine, Ser: 0.7 mg/dL (ref 0.61–1.24)
GFR calc Af Amer: >60 mL/min (ref >60)
GFR calc non Af Amer: >60 mL/min (ref >60)
Glucose, Bld: 165 mg/dL — ABNORMAL HIGH (ref 70–99)
Potassium: 4 mmol/L (ref 3.5–5.1)
Sodium: 143 mmol/L (ref 135–145)

## 2019-12-25 LAB — MAGNESIUM: Magnesium: 2.1 mg/dL (ref 1.7–2.4)

## 2019-12-25 LAB — PHOSPHORUS: Phosphorus: 2.8 mg/dL (ref 2.5–4.6)

## 2019-12-25 MED ORDER — IOHEXOL 300 MG/ML  SOLN
150.0000 mL | Freq: Once | INTRAMUSCULAR | Status: AC | PRN
  Administered 2019-12-25: 17:00:00 20 mL

## 2019-12-25 NOTE — Progress Notes (Addendum)
Pulmonary Critical Care Medicine [facility] [place]   PULMONARY CRITICAL CARE SERVICE  PROGRESS NOTE  Date of Service: 12/25/2019  Sean Porter  YBO:[id number]  DOB: May 08, 1951   DOA: 12/19/2019  Referring Physician: Carron Curie, MD  HPI: Sean Porter is a 69 y.o. male seen for follow up of Acute on Chronic Respiratory Failure.  Patient currently is on T collar has been on 28% FiO2 moderate amount of secretions have been observed seems to be doing well so far  Medications: Reviewed on Rounds  Physical Exam:  Vitals: Temperature is 98.4 pulse 82 respiratory rate 18 blood pressure is 148/78 saturations 96%  Ventilator Settings off ventilator on T collar currently on 28% FiO2  . General: Comfortable at this time . Eyes: Grossly normal lids, irises & conjunctiva . ENT: grossly tongue is normal . Neck: no obvious mass . Cardiovascular: S1 S2 normal no gallop . Respiratory: Scattered rhonchi expansion is equal . Abdomen: soft . Skin: no rash seen on limited exam . Musculoskeletal: not rigid . Psychiatric:unable to assess . Neurologic: no seizure no involuntary movements         Lab Data:   Basic Metabolic Panel: Recent Labs  Lab 12/20/19 0811 12/21/19 0530 12/24/19 0444 12/25/19 0557  NA 148* 145 147* 143  K 4.3 4.1 4.3 4.0  CL 103 105 107 103  CO2 33* 33* 32 31  GLUCOSE 139* 210* 145* 165*  BUN 21 18 20 19   CREATININE 0.83 0.85 0.74 0.70  CALCIUM 9.4 9.2 9.3 9.6  MG 2.2 2.1 2.2 2.1  PHOS 2.5 1.9* 2.9 2.8    ABG: No results for input(s): PHART, PCO2ART, PO2ART, HCO3, O2SAT in the last 168 hours.  Liver Function Tests: Recent Labs  Lab 12/20/19 0811  AST 20  ALT 21  ALKPHOS 61  BILITOT 0.7  PROT 5.9*  ALBUMIN 2.4*   No results for input(s): LIPASE, AMYLASE in the last 168 hours. No results for input(s): AMMONIA in the last 168 hours.  CBC: Recent Labs  Lab 12/20/19 0811 12/21/19 0530 12/24/19 0444 12/25/19 0557  WBC 8.7 11.2* 6.6  7.3  NEUTROABS 7.1  --   --   --   HGB 10.2* 10.4* 10.4* 10.6*  HCT 35.6* 34.7* 35.8* 36.2*  MCV 98.9 95.3 97.3 96.8  PLT 297 302 266 249    Cardiac Enzymes: No results for input(s): CKTOTAL, CKMB, CKMBINDEX, TROPONINI in the last 168 hours.  BNP (last 3 results) No results for input(s): BNP in the last 8760 hours.  ProBNP (last 3 results) No results for input(s): PROBNP in the last 8760 hours.  Radiological Exams: No results found.  Assessment/Plan Active Problems:   Acute on chronic respiratory failure with hypoxia (HCC)   Congestive heart failure due to cardiomyopathy (HCC)   Chronic atrial flutter (HCC)   Severe sepsis with acute organ dysfunction due to Enterococcus species (HCC)   Acute renal failure due to tubular necrosis (HCC)   1. Acute on chronic respiratory failure with hypoxia patient is going to be on T collar currently on 28% FiO2 secretions are still moderate needs ongoing aggressive pulmonary toilet 2. Congestive heart failure cardiomyopathy treated we will continue with supportive care 3. Chronic atrial flutter rate is controlled 4. Severe sepsis resolved 5. Acute renal failure following labs closely we will continue with supportive care   I have personally seen and evaluated the patient, evaluated laboratory and imaging results, formulated the assessment and plan and placed orders. The Patient requires high  complexity decision making with multiple systems involvement.  Rounds were done with the Respiratory Therapy Director and Staff therapists and discussed with nursing staff also.  Allyne Gee, MD [facility] Pulmonary Critical Care Medicine Sleep Medicine

## 2019-12-25 NOTE — Procedures (Signed)
Interventional Radiology Procedure Note  Procedure:  Image guided exchange of displaced GJ feeding tube.  New 69F GJ tube. .  Complications: None  Recommendations:  - Ok to use - Do not submerge - Routine care   Signed,  Yvone Neu. Loreta Ave, DO

## 2019-12-26 ENCOUNTER — Other Ambulatory Visit (HOSPITAL_COMMUNITY): Payer: Medicare HMO

## 2019-12-26 DIAGNOSIS — I509 Heart failure, unspecified: Secondary | ICD-10-CM | POA: Diagnosis not present

## 2019-12-26 DIAGNOSIS — N170 Acute kidney failure with tubular necrosis: Secondary | ICD-10-CM | POA: Diagnosis not present

## 2019-12-26 DIAGNOSIS — J9621 Acute and chronic respiratory failure with hypoxia: Secondary | ICD-10-CM | POA: Diagnosis not present

## 2019-12-26 DIAGNOSIS — I4892 Unspecified atrial flutter: Secondary | ICD-10-CM | POA: Diagnosis not present

## 2019-12-26 NOTE — Progress Notes (Signed)
Pulmonary Critical Care Medicine [place]   PULMONARY CRITICAL CARE SERVICE  PROGRESS NOTE  Date of Service: 12/26/2019  Sean Porter  LZJ:[id number]  DOB: 01/08/1951   DOA: 12/19/2019  Referring Physician: Merton Border, MD  HPI: Sean Porter is a 69 y.o. male seen for follow up of Acute on Chronic Respiratory Failure.  Patient at this time is on T collar has been off the ventilator requiring 40% FiO2.  Secretions are still noted to be somewhat copious.  Right now with the increase in the FiO2 requirements have recommended getting follow-up chest x-ray  Medications: Reviewed on Rounds  Physical Exam:  Vitals: Temperature is 96.9 pulse 74 respiratory 18 blood pressure is 141/74 saturations 97%  Ventilator Settings on T collar currently on 40% FiO2  . General: Comfortable at this time . Eyes: Grossly normal lids, irises & conjunctiva . ENT: grossly tongue is normal . Neck: no obvious mass . Cardiovascular: S1 S2 normal no gallop . Respiratory: No rhonchi coarse breath sounds are noted . Abdomen: soft . Skin: no rash seen on limited exam . Musculoskeletal: not rigid . Psychiatric:unable to assess . Neurologic: no seizure no involuntary movements         Lab Data:   Basic Metabolic Panel: Recent Labs  Lab 12/20/19 0811 12/21/19 0530 12/24/19 0444 12/25/19 0557  NA 148* 145 147* 143  K 4.3 4.1 4.3 4.0  CL 103 105 107 103  CO2 33* 33* 32 31  GLUCOSE 139* 210* 145* 165*  BUN 21 18 20 19   CREATININE 0.83 0.85 0.74 0.70  CALCIUM 9.4 9.2 9.3 9.6  MG 2.2 2.1 2.2 2.1  PHOS 2.5 1.9* 2.9 2.8    ABG: No results for input(s): PHART, PCO2ART, PO2ART, HCO3, O2SAT in the last 168 hours.  Liver Function Tests: Recent Labs  Lab 12/20/19 0811  AST 20  ALT 21  ALKPHOS 61  BILITOT 0.7  PROT 5.9*  ALBUMIN 2.4*   No results for input(s): LIPASE, AMYLASE in the last 168 hours. No results for input(s): AMMONIA in the last 168 hours.  CBC: Recent Labs   Lab 12/20/19 0811 12/21/19 0530 12/24/19 0444 12/25/19 0557  WBC 8.7 11.2* 6.6 7.3  NEUTROABS 7.1  --   --   --   HGB 10.2* 10.4* 10.4* 10.6*  HCT 35.6* 34.7* 35.8* 36.2*  MCV 98.9 95.3 97.3 96.8  PLT 297 302 266 249    Cardiac Enzymes: No results for input(s): CKTOTAL, CKMB, CKMBINDEX, TROPONINI in the last 168 hours.  BNP (last 3 results) No results for input(s): BNP in the last 8760 hours.  ProBNP (last 3 results) No results for input(s): PROBNP in the last 8760 hours.  Radiological Exams: IR GJ Tube Change  Result Date: 12/25/2019 INDICATION: 69 year old male with displaced gastro jejunal tube EXAM: IMAGE GUIDED REPLACEMENT OF ENTERIC FEEDING TUBE MEDICATIONS: None ANESTHESIA/SEDATION: None CONTRAST:  20 cc-administered into the gastric lumen. FLUOROSCOPY TIME:  Fluoroscopy Time: 3 minutes 6 seconds (86 mGy). COMPLICATIONS: None PROCEDURE: Informed written consent was obtained from the patient after a thorough discussion of the procedural risks, benefits and alternatives. All questions were addressed. Maximal Sterile Barrier Technique was utilized including caps, mask, sterile gowns, sterile gloves, sterile drape, hand hygiene and skin antiseptic. A timeout was performed prior to the initiation of the procedure. Scout images were acquired. Contrast injected through the indwelling jejunal limb confirmed location within the proximal small bowel. Stiff Glidewire was then used, for modified Seldinger technique replacement of the  existing displaced GJ tube. A new 20 French balloon retention GJ was placed on the Glidewire. Contrast was injected to confirm location within both the stomach lumen and the small bowel. 8 cc of saline used to inflate the balloon within the stomach lumen. IMPRESSION: Status post placement of percutaneous GJ tube with new 20 French balloon retention tube. Signed, Yvone Neu. Reyne Dumas, RPVI [facility] Radiology  Electronically Signed   By: Gilmer Mor D.O.   On: 12/25/2019 17:22   DG CHEST PORT 1 VIEW  Result Date: 12/26/2019 CLINICAL DATA:  Pneumonia EXAM: PORTABLE CHEST 1 VIEW COMPARISON:  Five days ago FINDINGS: Tracheostomy tube remains seated. Low volume chest with hazy opacity. The diaphragms are less well distinguished in there may be increasing pleural fluid. No pneumothorax. Cardiomegaly. IMPRESSION: Worsening lower chest opacification and diaphragm obscuration which could be from worsening airspace disease or pleural fluid. Electronically Signed   By: Marnee Spring M.D.   On: 12/26/2019 09:48    Assessment/Plan Active Problems:   Acute on chronic respiratory failure with hypoxia (HCC)   Congestive heart failure due to cardiomyopathy (HCC)   Chronic atrial flutter (HCC)   Severe sepsis with acute organ dysfunction due to Enterococcus species (HCC)   Acute renal failure due to tubular necrosis (HCC)   1. Acute on chronic respiratory failure with hypoxia continue with T collar trials titrate oxygen continue pulmonary toilet patient is requiring 40% FiO2 follow-up chest x-ray was ordered it shows worsening of the lower chest opacification concerning for worsening pneumonia.  Will discuss with the primary care team 2. Congestive heart failure monitor fluid status diurese as tolerated 3. Chronic atrial flutter at baseline 4. Possible pneumonitis as above worsening lower chest opacification probable pneumonia versus atelectasis versus fluid 5. Acute renal failure we will continue with supportive care monitor labs closely.   I have personally seen and evaluated the patient, evaluated laboratory and imaging results, formulated the assessment and plan and placed orders. The Patient requires high complexity decision making with multiple systems involvement.  Rounds were done with the Respiratory Therapy Director and Staff therapists and discussed with nursing staff also.  Time 35 minutes acute  change in status  Yevonne Pax, MD [facility] Pulmonary Critical Care Medicine Sleep Medicine

## 2019-12-27 DIAGNOSIS — N170 Acute kidney failure with tubular necrosis: Secondary | ICD-10-CM | POA: Diagnosis not present

## 2019-12-27 DIAGNOSIS — I509 Heart failure, unspecified: Secondary | ICD-10-CM | POA: Diagnosis not present

## 2019-12-27 DIAGNOSIS — J9621 Acute and chronic respiratory failure with hypoxia: Secondary | ICD-10-CM | POA: Diagnosis not present

## 2019-12-27 DIAGNOSIS — I4892 Unspecified atrial flutter: Secondary | ICD-10-CM | POA: Diagnosis not present

## 2019-12-27 NOTE — Progress Notes (Addendum)
Pulmonary Critical Care Medicine [facility] [place]   PULMONARY CRITICAL CARE SERVICE  PROGRESS NOTE  Date of Service: 12/27/2019  Finch Costanzo  [id number]  DOB: December 21, 1950   DOA: 12/19/2019  Referring Physician: Carron Curie, MD  HPI: Sean Porter is a 69 y.o. male seen for follow up of Acute on Chronic Respiratory Failure.  Patient remains on 4% aerosol trach collar using PMV with no difficulty.  Medications: Reviewed on Rounds  Physical Exam:  Vitals: Pulse 79 respirations 22 BP 135/64 O2 sat 94% temp 97.6  Ventilator Settings 40% ATC  . General: Comfortable at this time . Eyes: Grossly normal lids, irises & conjunctiva . ENT: grossly tongue is normal . Neck: no obvious mass . Cardiovascular: S1 S2 normal no gallop . Respiratory: No rales or rhonchi noted . Abdomen: soft . Skin: no rash seen on limited exam . Musculoskeletal: not rigid . Psychiatric:unable to assess . Neurologic: no seizure no involuntary movements         Lab Data:   Basic Metabolic Panel: Recent Labs  Lab 12/21/19 0530 12/24/19 0444 12/25/19 0557  NA 145 147* 143  K 4.1 4.3 4.0  CL 105 107 103  CO2 33* 32 31  GLUCOSE 210* 145* 165*  BUN 18 20 19   CREATININE 0.85 0.74 0.70  CALCIUM 9.2 9.3 9.6  MG 2.1 2.2 2.1  PHOS 1.9* 2.9 2.8    ABG: No results for input(s): PHART, PCO2ART, PO2ART, HCO3, O2SAT in the last 168 hours.  Liver Function Tests: No results for input(s): AST, ALT, ALKPHOS, BILITOT, PROT, ALBUMIN in the last 168 hours. No results for input(s): LIPASE, AMYLASE in the last 168 hours. No results for input(s): AMMONIA in the last 168 hours.  CBC: Recent Labs  Lab 12/21/19 0530 12/24/19 0444 12/25/19 0557  WBC 11.2* 6.6 7.3  HGB 10.4* 10.4* 10.6*  HCT 34.7* 35.8* 36.2*  MCV 95.3 97.3 96.8  PLT 302 266 249    Cardiac Enzymes: No results for input(s): CKTOTAL, CKMB, CKMBINDEX, TROPONINI in the last 168 hours.  BNP (last 3 results) No results for  input(s): BNP in the last 8760 hours.  ProBNP (last 3 results) No results for input(s): PROBNP in the last 8760 hours.  Radiological Exams: DG CHEST PORT 1 VIEW  Result Date: 12/26/2019 CLINICAL DATA:  Pneumonia EXAM: PORTABLE CHEST 1 VIEW COMPARISON:  Five days ago FINDINGS: Tracheostomy tube remains seated. Low volume chest with hazy opacity. The diaphragms are less well distinguished in there may be increasing pleural fluid. No pneumothorax. Cardiomegaly. IMPRESSION: Worsening lower chest opacification and diaphragm obscuration which could be from worsening airspace disease or pleural fluid. Electronically Signed   By: 12/28/2019 M.D.   On: 12/26/2019 09:48    Assessment/Plan Active Problems:   Acute on chronic respiratory failure with hypoxia (HCC)   Congestive heart failure due to cardiomyopathy (HCC)   Chronic atrial flutter (HCC)   Severe sepsis with acute organ dysfunction due to Enterococcus species (HCC)   Acute renal failure due to tubular necrosis (HCC)   1. Acute on chronic respiratory failure with hypoxia continue with T collar trials titrate oxygen continue pulmonary toilet patient is requiring 40% FiO2 continue supportive measures and pulmonary toilet. 2. Congestive heart failure monitor fluid status diurese as tolerated 3. Chronic atrial flutter at baseline 4. Possible pneumonitis as above worsening lower chest opacification probable pneumonia versus atelectasis versus fluid 5. Acute renal failure we will continue with supportive care monitor labs closely.   I  have personally seen and evaluated the patient, evaluated laboratory and imaging results, formulated the assessment and plan and placed orders. The Patient requires high complexity decision making with multiple systems involvement.  Rounds were done with the Respiratory Therapy Director and Staff therapists and discussed with nursing staff also.  Allyne Gee, MD [facility] Pulmonary Critical Care  Medicine Sleep Medicine

## 2019-12-28 DIAGNOSIS — I509 Heart failure, unspecified: Secondary | ICD-10-CM | POA: Diagnosis not present

## 2019-12-28 DIAGNOSIS — J9621 Acute and chronic respiratory failure with hypoxia: Secondary | ICD-10-CM | POA: Diagnosis not present

## 2019-12-28 DIAGNOSIS — N170 Acute kidney failure with tubular necrosis: Secondary | ICD-10-CM | POA: Diagnosis not present

## 2019-12-28 DIAGNOSIS — I4892 Unspecified atrial flutter: Secondary | ICD-10-CM | POA: Diagnosis not present

## 2019-12-28 LAB — CBC
HCT: 35.3 % — ABNORMAL LOW (ref 39.0–52.0)
Hemoglobin: 10.5 g/dL — ABNORMAL LOW (ref 13.0–17.0)
MCH: 28 pg (ref 26.0–34.0)
MCHC: 29.7 g/dL — ABNORMAL LOW (ref 30.0–36.0)
MCV: 94.1 fL (ref 80.0–100.0)
Platelets: 254 10*3/uL (ref 150–400)
RBC: 3.75 MIL/uL — ABNORMAL LOW (ref 4.22–5.81)
RDW: 15.1 % (ref 11.5–15.5)
WBC: 6.7 10*3/uL (ref 4.0–10.5)
nRBC: 0 % (ref 0.0–0.2)

## 2019-12-28 LAB — BASIC METABOLIC PANEL
Anion gap: 10 (ref 5–15)
BUN: 23 mg/dL (ref 8–23)
CO2: 36 mmol/L — ABNORMAL HIGH (ref 22–32)
Calcium: 9.4 mg/dL (ref 8.9–10.3)
Chloride: 96 mmol/L — ABNORMAL LOW (ref 98–111)
Creatinine, Ser: 1.12 mg/dL (ref 0.61–1.24)
GFR calc Af Amer: >60 mL/min (ref >60)
GFR calc non Af Amer: >60 mL/min (ref >60)
Glucose, Bld: 172 mg/dL — ABNORMAL HIGH (ref 70–99)
Potassium: 3.6 mmol/L (ref 3.5–5.1)
Sodium: 142 mmol/L (ref 135–145)

## 2019-12-28 NOTE — Progress Notes (Addendum)
Pulmonary Critical Care Medicine [facility] [place]   PULMONARY CRITICAL CARE SERVICE  PROGRESS NOTE  Date of Service: 12/28/2019  Jocelyn Lowery  VQM:[id number]  DOB: 1951-05-21   DOA: 12/19/2019  Referring Physician: Carron Curie, MD  HPI: Delphin Funes is a 69 y.o. male seen for follow up of Acute on Chronic Respiratory Failure.  Patient currently is on T collar has been on 35% FiO2 with good saturations right now is using the PMV without any difficulty  Medications: Reviewed on Rounds  Physical Exam:  Vitals: Temperature is 97.1 pulse 72 respiratory rate 17 blood pressure is 1.2/64 saturations 97%  Ventilator Settings on T collar with an FiO2 35%  . General: Comfortable at this time . Eyes: Grossly normal lids, irises & conjunctiva . ENT: grossly tongue is normal . Neck: no obvious mass . Cardiovascular: S1 S2 normal no gallop . Respiratory: No rhonchi no rales are noted at this time . Abdomen: soft . Skin: no rash seen on limited exam . Musculoskeletal: not rigid . Psychiatric:unable to assess . Neurologic: no seizure no involuntary movements         Lab Data:   Basic Metabolic Panel: Recent Labs  Lab 12/24/19 0444 12/25/19 0557 12/28/19 0641  NA 147* 143 142  K 4.3 4.0 3.6  CL 107 103 96*  CO2 32 31 36*  GLUCOSE 145* 165* 172*  BUN 20 19 23   CREATININE 0.74 0.70 1.12  CALCIUM 9.3 9.6 9.4  MG 2.2 2.1  --   PHOS 2.9 2.8  --     ABG: No results for input(s): PHART, PCO2ART, PO2ART, HCO3, O2SAT in the last 168 hours.  Liver Function Tests: No results for input(s): AST, ALT, ALKPHOS, BILITOT, PROT, ALBUMIN in the last 168 hours. No results for input(s): LIPASE, AMYLASE in the last 168 hours. No results for input(s): AMMONIA in the last 168 hours.  CBC: Recent Labs  Lab 12/24/19 0444 12/25/19 0557 12/28/19 0641  WBC 6.6 7.3 6.7  HGB 10.4* 10.6* 10.5*  HCT 35.8* 36.2* 35.3*  MCV 97.3 96.8 94.1  PLT 266 249 254    Cardiac Enzymes: No  results for input(s): CKTOTAL, CKMB, CKMBINDEX, TROPONINI in the last 168 hours.  BNP (last 3 results) No results for input(s): BNP in the last 8760 hours.  ProBNP (last 3 results) No results for input(s): PROBNP in the last 8760 hours.  Radiological Exams: No results found.  Assessment/Plan Active Problems:   Acute on chronic respiratory failure with hypoxia (HCC)   Congestive heart failure due to cardiomyopathy (HCC)   Chronic atrial flutter (HCC)   Severe sepsis with acute organ dysfunction due to Enterococcus species (HCC)   Acute renal failure due to tubular necrosis (HCC)   1. Acute on chronic respiratory failure with hypoxia plan is to continue T collar trials patient is requiring 35% FiO2 patient is also using PMV which will be advanced. 2. Congestive heart failure with cardiomyopathy no improvement we'll continue with supportive care 3. Chronic atrial fibrillation rate is controlled 4. Severe sepsis hemodynamics right now are stable 5. Acute renal failure we'll continue with supportive care   I have personally seen and evaluated the patient, evaluated laboratory and imaging results, formulated the assessment and plan and placed orders. The Patient requires high complexity decision making with multiple systems involvement.  Rounds were done with the Respiratory Therapy Director and Staff therapists and discussed with nursing staff also.  12/30/19, MD [facility] Pulmonary Critical Care Medicine Sleep Medicine

## 2019-12-29 ENCOUNTER — Other Ambulatory Visit (HOSPITAL_COMMUNITY): Payer: Medicare HMO

## 2019-12-29 DIAGNOSIS — I509 Heart failure, unspecified: Secondary | ICD-10-CM | POA: Diagnosis not present

## 2019-12-29 DIAGNOSIS — N170 Acute kidney failure with tubular necrosis: Secondary | ICD-10-CM | POA: Diagnosis not present

## 2019-12-29 DIAGNOSIS — I4892 Unspecified atrial flutter: Secondary | ICD-10-CM | POA: Diagnosis not present

## 2019-12-29 DIAGNOSIS — J9621 Acute and chronic respiratory failure with hypoxia: Secondary | ICD-10-CM | POA: Diagnosis not present

## 2019-12-29 LAB — BASIC METABOLIC PANEL
Anion gap: 8 (ref 5–15)
BUN: 40 mg/dL — ABNORMAL HIGH (ref 8–23)
CO2: 35 mmol/L — ABNORMAL HIGH (ref 22–32)
Calcium: 9.4 mg/dL (ref 8.9–10.3)
Chloride: 97 mmol/L — ABNORMAL LOW (ref 98–111)
Creatinine, Ser: 1.41 mg/dL — ABNORMAL HIGH (ref 0.61–1.24)
GFR calc Af Amer: 58 mL/min — ABNORMAL LOW (ref >60)
GFR calc non Af Amer: 50 mL/min — ABNORMAL LOW (ref >60)
Glucose, Bld: 203 mg/dL — ABNORMAL HIGH (ref 70–99)
Potassium: 3.9 mmol/L (ref 3.5–5.1)
Sodium: 140 mmol/L (ref 135–145)

## 2019-12-29 LAB — HEPARIN INDUCED PLATELET AB (HIT ANTIBODY): Heparin Induced Plt Ab: 0.15 OD (ref 0.000–0.400)

## 2019-12-29 LAB — SARS CORONAVIRUS 2 (TAT 6-24 HRS): SARS Coronavirus 2: NEGATIVE

## 2019-12-29 NOTE — Progress Notes (Addendum)
Pulmonary Critical Care Medicine [place]   PULMONARY CRITICAL CARE SERVICE  PROGRESS NOTE  Date of Service: 12/29/2019  Kaled Allende  [id number]:[id number]  DOB: [birth year]   DOA: 12/19/2019  Referring Physician: Merton Border, MD  HPI: Derwood Becraft is a 69 y.o. male seen for follow up of Acute on Chronic Respiratory Failure.  Patient is on 35% aerosol trach collar using PMV with no difficulty.  Medications: Reviewed on Rounds  Physical Exam:  Vitals: Pulse 73 respirations 20 BP 94/46 O2 sat 90% T 96.7  Ventilator Settings 35% ATC  . General: Comfortable at this time . Eyes: Grossly normal lids, irises & conjunctiva . ENT: grossly tongue is normal . Neck: no obvious mass . Cardiovascular: S1 S2 normal no gallop . Respiratory: No rales or rhonchi noted . Abdomen: soft . Skin: no rash seen on limited exam . Musculoskeletal: not rigid . Psychiatric:unable to assess . Neurologic: no seizure no involuntary movements         Lab Data:   Basic Metabolic Panel: Recent Labs  Lab 12/24/19 0444 12/25/19 0557 12/28/19 0641 12/29/19 0800  NA 147* 143 142 140  K 4.3 4.0 3.6 3.9  CL 107 103 96* 97*  CO2 32 31 36* 35*  GLUCOSE 145* 165* 172* 203*  BUN 20 19 23  40*  CREATININE 0.74 0.70 1.12 1.41*  CALCIUM 9.3 9.6 9.4 9.4  MG 2.2 2.1  --   --   PHOS 2.9 2.8  --   --     ABG: No results for input(s): PHART, PCO2ART, PO2ART, HCO3, O2SAT in the last 168 hours.  Liver Function Tests: No results for input(s): AST, ALT, ALKPHOS, BILITOT, PROT, ALBUMIN in the last 168 hours. No results for input(s): LIPASE, AMYLASE in the last 168 hours. No results for input(s): AMMONIA in the last 168 hours.  CBC: Recent Labs  Lab 12/24/19 0444 12/25/19 0557 12/28/19 0641  WBC 6.6 7.3 6.7  HGB 10.4* 10.6* 10.5*  HCT 35.8* 36.2* 35.3*  MCV 97.3 96.8 94.1  PLT 266 249 254    Cardiac Enzymes: No results for input(s): CKTOTAL, CKMB, CKMBINDEX, TROPONINI in the last 168  hours.  BNP (last 3 results) No results for input(s): BNP in the last 8760 hours.  ProBNP (last 3 results) No results for input(s): PROBNP in the last 8760 hours.  Radiological Exams: DG CHEST PORT 1 VIEW  Result Date: 12/29/2019 CLINICAL DATA:  Pneumonia. EXAM: PORTABLE CHEST 1 VIEW COMPARISON:  One-view chest XR 12/26/2019 FINDINGS: The heart is enlarged. Atherosclerotic changes are noted at the arch. Tracheostomy tube is in place. Bilateral effusions are present. Bibasilar airspace opacities are slightly improved. IMPRESSION: 1. Improving bibasilar airspace disease. 2. Bilateral pleural effusions. 3. Tracheostomy tube in stable and satisfactory position. Electronically Signed   By: San Morelle M.D.   On: 12/29/2019 06:48    Assessment/Plan Active Problems:   Acute on chronic respiratory failure with hypoxia (HCC)   Congestive heart failure due to cardiomyopathy (HCC)   Chronic atrial flutter (HCC)   Severe sepsis with acute organ dysfunction due to Enterococcus species ([place])   Acute renal failure due to tubular necrosis ([place])   1. Acute on chronic respiratory failure with hypoxia plan is to continue T collar trials patient is requiring 35% FiO2 patient is also using PMV which will be advanced. 2. Congestive heart failure with cardiomyopathy no improvement we'll continue with supportive care 3. Chronic atrial fibrillation rate is controlled 4. Severe sepsis hemodynamics right now  are stable 5. Acute renal failure we'll continue with supportive care   I have personally seen and evaluated the patient, evaluated laboratory and imaging results, formulated the assessment and plan and placed orders. The Patient requires high complexity decision making with multiple systems involvement.  Rounds were done with the Respiratory Therapy Director and Staff therapists and discussed with nursing staff also.  Yevonne Pax, MD [facility] Pulmonary Critical Care Medicine Sleep Medicine

## 2019-12-30 DIAGNOSIS — I4892 Unspecified atrial flutter: Secondary | ICD-10-CM | POA: Diagnosis not present

## 2019-12-30 DIAGNOSIS — I509 Heart failure, unspecified: Secondary | ICD-10-CM | POA: Diagnosis not present

## 2019-12-30 DIAGNOSIS — J9621 Acute and chronic respiratory failure with hypoxia: Secondary | ICD-10-CM | POA: Diagnosis not present

## 2019-12-30 DIAGNOSIS — N170 Acute kidney failure with tubular necrosis: Secondary | ICD-10-CM | POA: Diagnosis not present

## 2019-12-30 LAB — RENAL FUNCTION PANEL
Albumin: 2.2 g/dL — ABNORMAL LOW (ref 3.5–5.0)
Anion gap: 9 (ref 5–15)
BUN: 38 mg/dL — ABNORMAL HIGH (ref 8–23)
CO2: 34 mmol/L — ABNORMAL HIGH (ref 22–32)
Calcium: 9.4 mg/dL (ref 8.9–10.3)
Chloride: 97 mmol/L — ABNORMAL LOW (ref 98–111)
Creatinine, Ser: 1.19 mg/dL (ref 0.61–1.24)
GFR calc Af Amer: >60 mL/min (ref >60)
GFR calc non Af Amer: >60 mL/min (ref >60)
Glucose, Bld: 202 mg/dL — ABNORMAL HIGH (ref 70–99)
Phosphorus: 2.9 mg/dL (ref 2.5–4.6)
Potassium: 4 mmol/L (ref 3.5–5.1)
Sodium: 140 mmol/L (ref 135–145)

## 2019-12-30 NOTE — Progress Notes (Addendum)
Pulmonary Critical Care Medicine [facility] [place]   PULMONARY CRITICAL CARE SERVICE  PROGRESS NOTE  Date of Service: 12/30/2019  Sean Porter  IRC:[id number]  DOB: [birth year]   DOA: 12/19/2019  Referring Physician: Carron Curie, MD  HPI: Sean Porter is a 69 y.o. male seen for follow up of Acute on Chronic Respiratory Failure.  Patient remains on 35% aerosol trach collar satting well no fever or distress.  Medications: Reviewed on Rounds  Physical Exam:  Vitals: Pulse 77 respirations 20 BP 100/54 O2 sat 99% temp 96.9  Ventilator Settings 35% ATC  . General: Comfortable at this time . Eyes: Grossly normal lids, irises & conjunctiva . ENT: grossly tongue is normal . Neck: no obvious mass . Cardiovascular: S1 S2 normal no gallop . Respiratory: No rales or rhonchi noted . Abdomen: soft . Skin: no rash seen on limited exam . Musculoskeletal: not rigid . Psychiatric:unable to assess . Neurologic: no seizure no involuntary movements         Lab Data:   Basic Metabolic Panel: Recent Labs  Lab 12/24/19 0444 12/25/19 0557 12/28/19 0641 12/29/19 0800 12/30/19 0726  NA 147* 143 142 140 140  K 4.3 4.0 3.6 3.9 4.0  CL 107 103 96* 97* 97*  CO2 32 31 36* 35* 34*  GLUCOSE 145* 165* 172* 203* 202*  BUN 20 19 23  40* 38*  CREATININE 0.74 0.70 1.12 1.41* 1.19  CALCIUM 9.3 9.6 9.4 9.4 9.4  MG 2.2 2.1  --   --   --   PHOS 2.9 2.8  --   --  2.9    ABG: No results for input(s): PHART, PCO2ART, PO2ART, HCO3, O2SAT in the last 168 hours.  Liver Function Tests: Recent Labs  Lab 12/30/19 0726  ALBUMIN 2.2*   No results for input(s): LIPASE, AMYLASE in the last 168 hours. No results for input(s): AMMONIA in the last 168 hours.  CBC: Recent Labs  Lab 12/24/19 0444 12/25/19 0557 12/28/19 0641  WBC 6.6 7.3 6.7  HGB 10.4* 10.6* 10.5*  HCT 35.8* 36.2* 35.3*  MCV 97.3 96.8 94.1  PLT 266 249 254    Cardiac Enzymes: No results for input(s): CKTOTAL, CKMB,  CKMBINDEX, TROPONINI in the last 168 hours.  BNP (last 3 results) No results for input(s): BNP in the last 8760 hours.  ProBNP (last 3 results) No results for input(s): PROBNP in the last 8760 hours.  Radiological Exams: 12/30/19 Abdomen Limited  Result Date: 12/29/2019 CLINICAL DATA:  Ascites EXAM: LIMITED ABDOMEN ULTRASOUND FOR ASCITES TECHNIQUE: Limited ultrasound survey for ascites was performed in all four abdominal quadrants. COMPARISON:  CT dated 10/20/2020 FINDINGS: Evaluation of the abdomen demonstrates no significant ascites. IMPRESSION: No significant ascites identified. Electronically Signed   By: 10/22/2020 M.D.   On: 12/29/2019 17:25   DG CHEST PORT 1 VIEW  Result Date: 12/29/2019 CLINICAL DATA:  Pneumonia. EXAM: PORTABLE CHEST 1 VIEW COMPARISON:  One-view chest XR 12/26/2019 FINDINGS: The heart is enlarged. Atherosclerotic changes are noted at the arch. Tracheostomy tube is in place. Bilateral effusions are present. Bibasilar airspace opacities are slightly improved. IMPRESSION: 1. Improving bibasilar airspace disease. 2. Bilateral pleural effusions. 3. Tracheostomy tube in stable and satisfactory position. Electronically Signed   By: 12/28/2019 M.D.   On: 12/29/2019 06:48    Assessment/Plan Active Problems:   Acute on chronic respiratory failure with hypoxia (HCC)   Congestive heart failure due to cardiomyopathy (HCC)   Chronic atrial flutter (HCC)   Severe sepsis  with acute organ dysfunction due to Enterococcus species [facility])   Acute renal failure due to tubular necrosis ([place])   1. Acute on chronic respiratory failure with hypoxia continue on 30% aerosol trach collar continue wean FiO2 as patient tolerated continue supportive measures and pulmonary toilet. 2. Congestive heart failure with cardiomyopathy no improvement we'll continue with supportive care 3. Chronic atrial fibrillation rate is controlled 4. Severe sepsis hemodynamics right now are  stable 5. Acute renal failure we'll continue with supportive care   I have personally seen and evaluated the patient, evaluated laboratory and imaging results, formulated the assessment and plan and placed orders. The Patient requires high complexity decision making with multiple systems involvement.  Rounds were done with the Respiratory Therapy Director and Staff therapists and discussed with nursing staff also.  Allyne Gee, MD [facility] Pulmonary Critical Care Medicine Sleep Medicine

## 2019-12-31 ENCOUNTER — Other Ambulatory Visit (HOSPITAL_COMMUNITY): Payer: Medicare HMO

## 2019-12-31 DIAGNOSIS — N170 Acute kidney failure with tubular necrosis: Secondary | ICD-10-CM | POA: Diagnosis not present

## 2019-12-31 DIAGNOSIS — J9621 Acute and chronic respiratory failure with hypoxia: Secondary | ICD-10-CM | POA: Diagnosis not present

## 2019-12-31 DIAGNOSIS — I4892 Unspecified atrial flutter: Secondary | ICD-10-CM | POA: Diagnosis not present

## 2019-12-31 DIAGNOSIS — I509 Heart failure, unspecified: Secondary | ICD-10-CM | POA: Diagnosis not present

## 2019-12-31 LAB — CBC
HCT: 34.5 % — ABNORMAL LOW (ref 39.0–52.0)
Hemoglobin: 10.3 g/dL — ABNORMAL LOW (ref 13.0–17.0)
MCH: 28.2 pg (ref 26.0–34.0)
MCHC: 29.9 g/dL — ABNORMAL LOW (ref 30.0–36.0)
MCV: 94.5 fL (ref 80.0–100.0)
Platelets: 259 10*3/uL (ref 150–400)
RBC: 3.65 MIL/uL — ABNORMAL LOW (ref 4.22–5.81)
RDW: 15.1 % (ref 11.5–15.5)
WBC: 6.3 10*3/uL (ref 4.0–10.5)
nRBC: 0 % (ref 0.0–0.2)

## 2019-12-31 MED ORDER — LIDOCAINE HCL (PF) 1 % IJ SOLN
INTRAMUSCULAR | Status: AC
  Filled 2019-12-31: qty 30

## 2019-12-31 NOTE — Progress Notes (Signed)
Pulmonary Critical Care Medicine [place]   PULMONARY CRITICAL CARE SERVICE  PROGRESS NOTE  Date of Service: 12/31/2019  Watt Geiler  TDD:[id number]  DOB: 12-09-1950   DOA: 12/19/2019  Referring Physician: Merton Border, MD  HPI: Jasher Barkan is a 69 y.o. male seen for follow up of Acute on Chronic Respiratory Failure.  Patient is on T collar currently on 35% FiO2 good saturations are noted.  Secretions are fair to moderate  Medications: Reviewed on Rounds  Physical Exam:  Vitals: Temperature is 97.6 pulse 83 respiratory rate 25 blood pressure is 127/72 saturations 94%  Ventilator Settings on T collar with an FiO2 of 35%  . General: Comfortable at this time . Eyes: Grossly normal lids, irises & conjunctiva . ENT: grossly tongue is normal . Neck: no obvious mass . Cardiovascular: S1 S2 normal no gallop . Respiratory: No rhonchi no rales noted at this time . Abdomen: soft . Skin: no rash seen on limited exam . Musculoskeletal: not rigid . Psychiatric:unable to assess . Neurologic: no seizure no involuntary movements         Lab Data:   Basic Metabolic Panel: Recent Labs  Lab 12/25/19 0557 12/28/19 0641 12/29/19 0800 12/30/19 0726  NA 143 142 140 140  K 4.0 3.6 3.9 4.0  CL 103 96* 97* 97*  CO2 31 36* 35* 34*  GLUCOSE 165* 172* 203* 202*  BUN 19 23 40* 38*  CREATININE 0.70 1.12 1.41* 1.19  CALCIUM 9.6 9.4 9.4 9.4  MG 2.1  --   --   --   PHOS 2.8  --   --  2.9    ABG: No results for input(s): PHART, PCO2ART, PO2ART, HCO3, O2SAT in the last 168 hours.  Liver Function Tests: Recent Labs  Lab 12/30/19 0726  ALBUMIN 2.2*   No results for input(s): LIPASE, AMYLASE in the last 168 hours. No results for input(s): AMMONIA in the last 168 hours.  CBC: Recent Labs  Lab 12/25/19 0557 12/28/19 0641  WBC 7.3 6.7  HGB 10.6* 10.5*  HCT 36.2* 35.3*  MCV 96.8 94.1  PLT 249 254    Cardiac Enzymes: No results for input(s): CKTOTAL, CKMB,  CKMBINDEX, TROPONINI in the last 168 hours.  BNP (last 3 results) No results for input(s): BNP in the last 8760 hours.  ProBNP (last 3 results) No results for input(s): PROBNP in the last 8760 hours.  Radiological Exams: US Abdomen Limited  Result Date: 12/29/2019 CLINICAL DATA:  Ascites EXAM: LIMITED ABDOMEN ULTRASOUND FOR ASCITES TECHNIQUE: Limited ultrasound survey for ascites was performed in all four abdominal quadrants. COMPARISON:  CT dated 10/20/2020 FINDINGS: Evaluation of the abdomen demonstrates no significant ascites. IMPRESSION: No significant ascites identified. Electronically Signed   By: Constance Holster M.D.   On: 12/29/2019 17:25    Assessment/Plan Active Problems:   Acute on chronic respiratory failure with hypoxia (HCC)   Congestive heart failure due to cardiomyopathy (HCC)   Chronic atrial flutter (HCC)   Severe sepsis with acute organ dysfunction due to Enterococcus species ([place])   Acute renal failure due to tubular necrosis ([place])   1. Acute on chronic respiratory failure hypoxia plan is to continue with T collar trials on 35% FiO2 titrate oxygen down as tolerated continue aggressive pulmonary toilet. 2. Congestive heart failure monitor fluid status.  Patient has been sent down to ultrasound for possibility of thoracentesis no fluid was visualized. 3. Chronic atrial flutter rate is controlled at this time 4. Severe sepsis due to  Enterococcus we will continue with supportive care 5. Acute renal failure supportive care we will continue to monitor closely   I have personally seen and evaluated the patient, evaluated laboratory and imaging results, formulated the assessment and plan and placed orders. The Patient requires high complexity decision making with multiple systems involvement.  Rounds were done with the Respiratory Therapy Director and Staff therapists and discussed with nursing staff also.  Yevonne Pax, MD [facility] Pulmonary Critical Care  Medicine Sleep Medicine

## 2019-12-31 NOTE — Progress Notes (Signed)
  I was present during US of the chest for possible thoracentesis.  There is no effusion seen.  Thoracentesis not performed.  Ovidio Steele S Finnis Colee PA-C 12/31/2019 10:07 AM

## 2020-01-01 DIAGNOSIS — J9621 Acute and chronic respiratory failure with hypoxia: Secondary | ICD-10-CM | POA: Diagnosis not present

## 2020-01-01 DIAGNOSIS — I509 Heart failure, unspecified: Secondary | ICD-10-CM | POA: Diagnosis not present

## 2020-01-01 DIAGNOSIS — I4892 Unspecified atrial flutter: Secondary | ICD-10-CM | POA: Diagnosis not present

## 2020-01-01 DIAGNOSIS — N170 Acute kidney failure with tubular necrosis: Secondary | ICD-10-CM | POA: Diagnosis not present

## 2020-01-01 LAB — BASIC METABOLIC PANEL
Anion gap: 9 (ref 5–15)
BUN: 19 mg/dL (ref 8–23)
CO2: 34 mmol/L — ABNORMAL HIGH (ref 22–32)
Calcium: 9.2 mg/dL (ref 8.9–10.3)
Chloride: 94 mmol/L — ABNORMAL LOW (ref 98–111)
Creatinine, Ser: 0.68 mg/dL (ref 0.61–1.24)
GFR calc Af Amer: >60 mL/min (ref >60)
GFR calc non Af Amer: >60 mL/min (ref >60)
Glucose, Bld: 206 mg/dL — ABNORMAL HIGH (ref 70–99)
Potassium: 4.7 mmol/L (ref 3.5–5.1)
Sodium: 137 mmol/L (ref 135–145)

## 2020-01-01 LAB — CBC
HCT: 35.1 % — ABNORMAL LOW (ref 39.0–52.0)
Hemoglobin: 10.5 g/dL — ABNORMAL LOW (ref 13.0–17.0)
MCH: 27.9 pg (ref 26.0–34.0)
MCHC: 29.9 g/dL — ABNORMAL LOW (ref 30.0–36.0)
MCV: 93.4 fL (ref 80.0–100.0)
Platelets: 233 10*3/uL (ref 150–400)
RBC: 3.76 MIL/uL — ABNORMAL LOW (ref 4.22–5.81)
RDW: 15 % (ref 11.5–15.5)
WBC: 8.1 10*3/uL (ref 4.0–10.5)
nRBC: 0 % (ref 0.0–0.2)

## 2020-01-01 LAB — MAGNESIUM: Magnesium: 2.1 mg/dL (ref 1.7–2.4)

## 2020-01-01 NOTE — Progress Notes (Addendum)
Pulmonary Critical Care Medicine [facility] [place]   PULMONARY CRITICAL CARE SERVICE  PROGRESS NOTE  Date of Service: 01/01/2020  Sean Porter  YDX:[id number]  DOB: [birth year]   DOA: 12/19/2019  Referring Physician: Carron Curie, MD  HPI: Sean Porter is a 69 y.o. male seen for follow up of Acute on Chronic Respiratory Failure.  Patient currently is on T collar has been on 35% FiO2 with good saturations  Medications: Reviewed on Rounds  Physical Exam:  Vitals: Temperature 97.0 pulse 76 respiratory rate is 25 blood pressure is 139/64 saturations 97%  Ventilator Settings on T collar with an FiO2 of 35% using PMV  . General: Comfortable at this time . Eyes: Grossly normal lids, irises & conjunctiva . ENT: grossly tongue is normal . Neck: no obvious mass . Cardiovascular: S1 S2 normal no gallop . Respiratory: No rhonchi no rales are noted at this time . Abdomen: soft . Skin: no rash seen on limited exam . Musculoskeletal: not rigid . Psychiatric:unable to assess . Neurologic: no seizure no involuntary movements         Lab Data:   Basic Metabolic Panel: Recent Labs  Lab 12/28/19 0641 12/29/19 0800 12/30/19 0726 01/01/20 0419  NA 142 140 140 137  K 3.6 3.9 4.0 4.7  CL 96* 97* 97* 94*  CO2 36* 35* 34* 34*  GLUCOSE 172* 203* 202* 206*  BUN 23 40* 38* 19  CREATININE 1.12 1.41* 1.19 0.68  CALCIUM 9.4 9.4 9.4 9.2  MG  --   --   --  2.1  PHOS  --   --  2.9  --     ABG: No results for input(s): PHART, PCO2ART, PO2ART, HCO3, O2SAT in the last 168 hours.  Liver Function Tests: Recent Labs  Lab 12/30/19 0726  ALBUMIN 2.2*   No results for input(s): LIPASE, AMYLASE in the last 168 hours. No results for input(s): AMMONIA in the last 168 hours.  CBC: Recent Labs  Lab 12/28/19 0641 12/31/19 1312 01/01/20 0419  WBC 6.7 6.3 8.1  HGB 10.5* 10.3* 10.5*  HCT 35.3* 34.5* 35.1*  MCV 94.1 94.5 93.4  PLT 254 259 233    Cardiac Enzymes: No results for  input(s): CKTOTAL, CKMB, CKMBINDEX, TROPONINI in the last 168 hours.  BNP (last 3 results) No results for input(s): BNP in the last 8760 hours.  ProBNP (last 3 results) No results for input(s): PROBNP in the last 8760 hours.  Radiological Exams: [place] CHEST (PLEURAL EFFUSION)  Result Date: 12/31/2019 CLINICAL DATA:  Evaluation for pleural effusion. EXAM: CHEST ULTRASOUND COMPARISON:  Chest x-ray dated 12/29/2019 FINDINGS: Evaluation of the left chest was performed by ultrasound. Grayscale imaging was obtained. There is no convincing left-sided pleural effusion. IMPRESSION: No convincing left-sided pleural effusion by ultrasound. Electronically Signed   By: Katherine Mantle M.D.   On: 12/31/2019 16:07    Assessment/Plan Active Problems:   Acute on chronic respiratory failure with hypoxia (HCC)   Congestive heart failure due to cardiomyopathy (HCC)   Chronic atrial flutter (HCC)   Severe sepsis with acute organ dysfunction due to Enterococcus species (HCC)   Acute renal failure due to tubular necrosis (HCC)   1. Acute on chronic respiratory failure with hypoxia we will continue with wean protocol using PMV on 35% FiO2 2. Congestive heart failure right now is compensated we will monitor 3. Chronic atrial flutter rate is controlled 4. Severe sepsis hemodynamics are stable improving 5. Acute renal failure continue to follow labs  I have personally seen and evaluated the patient, evaluated laboratory and imaging results, formulated the assessment and plan and placed orders. The Patient requires high complexity decision making with multiple systems involvement.  Rounds were done with the Respiratory Therapy Director and Staff therapists and discussed with nursing staff also.  Allyne Gee, MD [facility] Pulmonary Critical Care Medicine Sleep Medicine

## 2020-01-02 DIAGNOSIS — I509 Heart failure, unspecified: Secondary | ICD-10-CM | POA: Diagnosis not present

## 2020-01-02 DIAGNOSIS — N170 Acute kidney failure with tubular necrosis: Secondary | ICD-10-CM | POA: Diagnosis not present

## 2020-01-02 DIAGNOSIS — I4892 Unspecified atrial flutter: Secondary | ICD-10-CM | POA: Diagnosis not present

## 2020-01-02 DIAGNOSIS — J9621 Acute and chronic respiratory failure with hypoxia: Secondary | ICD-10-CM | POA: Diagnosis not present

## 2020-01-02 LAB — BASIC METABOLIC PANEL
Anion gap: 8 (ref 5–15)
BUN: 17 mg/dL (ref 8–23)
CO2: 34 mmol/L — ABNORMAL HIGH (ref 22–32)
Calcium: 9.4 mg/dL (ref 8.9–10.3)
Chloride: 95 mmol/L — ABNORMAL LOW (ref 98–111)
Creatinine, Ser: 0.72 mg/dL (ref 0.61–1.24)
GFR calc Af Amer: >60 mL/min (ref >60)
GFR calc non Af Amer: >60 mL/min (ref >60)
Glucose, Bld: 179 mg/dL — ABNORMAL HIGH (ref 70–99)
Potassium: 4.5 mmol/L (ref 3.5–5.1)
Sodium: 137 mmol/L (ref 135–145)

## 2020-01-02 LAB — MAGNESIUM: Magnesium: 2 mg/dL (ref 1.7–2.4)

## 2020-01-02 NOTE — Progress Notes (Signed)
Pulmonary Critical Care Medicine [facility] [place]   PULMONARY CRITICAL CARE SERVICE  PROGRESS NOTE  Date of Service: 01/02/2020  Sean Porter  [id number]:[id number]  DOB: 01-28-1951   DOA: 12/19/2019  Referring Physician: Carron Curie, MD  HPI: Sean Porter is a 69 y.o. male seen for follow up of Acute on Chronic Respiratory Failure.  Patient currently is on T collar has been on 35% FiO2 will be attempting to make another attempt at [facility]  Medications: Reviewed on Rounds  Physical Exam:  Vitals: Temperature is 97.9 pulse 81 respiratory 26 blood pressure is 164/78 saturations 100%  Ventilator Settings off the ventilator on T collar FiO2 35%  . General: Comfortable at this time . Eyes: Grossly normal lids, irises & conjunctiva . ENT: grossly tongue is normal . Neck: no obvious mass . Cardiovascular: S1 S2 normal no gallop . Respiratory: Scattered rhonchi expansion is equal . Abdomen: soft . Skin: no rash seen on limited exam . Musculoskeletal: not rigid . Psychiatric:unable to assess . Neurologic: no seizure no involuntary movements         Lab Data:   Basic Metabolic Panel: Recent Labs  Lab 12/28/19 0641 12/29/19 0800 12/30/19 0726 01/01/20 0419 01/02/20 0605  NA 142 140 140 137 137  K 3.6 3.9 4.0 4.7 4.5  CL 96* 97* 97* 94* 95*  CO2 36* 35* 34* 34* 34*  GLUCOSE 172* 203* 202* 206* 179*  BUN 23 40* 38* 19 17  CREATININE 1.12 1.41* 1.19 0.68 0.72  CALCIUM 9.4 9.4 9.4 9.2 9.4  MG  --   --   --  2.1 2.0  PHOS  --   --  2.9  --   --     ABG: No results for input(s): PHART, PCO2ART, PO2ART, HCO3, O2SAT in the last 168 hours.  Liver Function Tests: Recent Labs  Lab 12/30/19 0726  ALBUMIN 2.2*   No results for input(s): LIPASE, AMYLASE in the last 168 hours. No results for input(s): AMMONIA in the last 168 hours.  CBC: Recent Labs  Lab 12/28/19 0641 12/31/19 1312 01/01/20 0419  WBC 6.7 6.3 8.1  HGB 10.5* 10.3* 10.5*  HCT 35.3* 34.5* 35.1*  MCV  94.1 94.5 93.4  PLT 254 259 233    Cardiac Enzymes: No results for input(s): CKTOTAL, CKMB, CKMBINDEX, TROPONINI in the last 168 hours.  BNP (last 3 results) No results for input(s): BNP in the last 8760 hours.  ProBNP (last 3 results) No results for input(s): PROBNP in the last 8760 hours.  Radiological Exams: [place] CHEST (PLEURAL EFFUSION)  Result Date: 12/31/2019 CLINICAL DATA:  Evaluation for pleural effusion. EXAM: CHEST ULTRASOUND COMPARISON:  Chest x-ray dated 12/29/2019 FINDINGS: Evaluation of the left chest was performed by ultrasound. Grayscale imaging was obtained. There is no convincing left-sided pleural effusion. IMPRESSION: No convincing left-sided pleural effusion by ultrasound. Electronically Signed   By: Katherine Mantle M.D.   On: 12/31/2019 16:07    Assessment/Plan Active Problems:   Acute on chronic respiratory failure with hypoxia (HCC)   Congestive heart failure due to cardiomyopathy (HCC)   Chronic atrial flutter (HCC)   Severe sepsis with acute organ dysfunction due to Enterococcus species (HCC)   Acute renal failure due to tubular necrosis (HCC)   1. Acute on chronic respiratory failure with hypoxia we will continue with T collar titrate oxygen continue pulmonary toilet 2. Congestive heart failure due to cardiomyopathy we will continue with supportive care no significant pleural effusions are noted on ultrasound as  was previously suspected 3. Chronic atrial flutter rate is controlled 4. Severe sepsis treated clinically improving 5. Acute renal failure following labs improved   I have personally seen and evaluated the patient, evaluated laboratory and imaging results, formulated the assessment and plan and placed orders. The Patient requires high complexity decision making with multiple systems involvement.  Rounds were done with the Respiratory Therapy Director and Staff therapists and discussed with nursing staff also.  Allyne Gee, MD  [facility] Pulmonary Critical Care Medicine Sleep Medicine

## 2020-01-03 DIAGNOSIS — J9621 Acute and chronic respiratory failure with hypoxia: Secondary | ICD-10-CM | POA: Diagnosis not present

## 2020-01-03 DIAGNOSIS — N170 Acute kidney failure with tubular necrosis: Secondary | ICD-10-CM | POA: Diagnosis not present

## 2020-01-03 DIAGNOSIS — I509 Heart failure, unspecified: Secondary | ICD-10-CM | POA: Diagnosis not present

## 2020-01-03 DIAGNOSIS — I4892 Unspecified atrial flutter: Secondary | ICD-10-CM | POA: Diagnosis not present

## 2020-01-03 NOTE — Progress Notes (Addendum)
Pulmonary Critical Care Medicine [facility] [place]   PULMONARY CRITICAL CARE SERVICE  PROGRESS NOTE  Date of Service: 01/03/2020  Sean Porter  RKY:[id number]  DOB: 1951/06/03   DOA: 12/19/2019  Referring Physician: Carron Curie, MD  HPI: Sean Porter is a 69 y.o. male seen for follow up of Acute on Chronic Respiratory Failure.  Right now is on T collar on 20% FiO2 with the PMV in place  Medications: Reviewed on Rounds  Physical Exam:  Vitals: Temperature is 98.1 pulse 77 respiratory 33 blood pressure is 121/61 saturations 99%  Ventilator Settings off the ventilator on T collar with FiO2 28%  . General: Comfortable at this time . Eyes: Grossly normal lids, irises & conjunctiva . ENT: grossly tongue is normal . Neck: no obvious mass . Cardiovascular: S1 S2 normal no gallop . Respiratory: No rhonchi no rales are noted at this time . Abdomen: soft . Skin: no rash seen on limited exam . Musculoskeletal: not rigid . Psychiatric:unable to assess . Neurologic: no seizure no involuntary movements         Lab Data:   Basic Metabolic Panel: Recent Labs  Lab 12/28/19 0641 12/29/19 0800 12/30/19 0726 01/01/20 0419 01/02/20 0605  NA 142 140 140 137 137  K 3.6 3.9 4.0 4.7 4.5  CL 96* 97* 97* 94* 95*  CO2 36* 35* 34* 34* 34*  GLUCOSE 172* 203* 202* 206* 179*  BUN 23 40* 38* 19 17  CREATININE 1.12 1.41* 1.19 0.68 0.72  CALCIUM 9.4 9.4 9.4 9.2 9.4  MG  --   --   --  2.1 2.0  PHOS  --   --  2.9  --   --     ABG: No results for input(s): PHART, PCO2ART, PO2ART, HCO3, O2SAT in the last 168 hours.  Liver Function Tests: Recent Labs  Lab 12/30/19 0726  ALBUMIN 2.2*   No results for input(s): LIPASE, AMYLASE in the last 168 hours. No results for input(s): AMMONIA in the last 168 hours.  CBC: Recent Labs  Lab 12/28/19 0641 12/31/19 1312 01/01/20 0419  WBC 6.7 6.3 8.1  HGB 10.5* 10.3* 10.5*  HCT 35.3* 34.5* 35.1*  MCV 94.1 94.5 93.4  PLT 254 259 233     Cardiac Enzymes: No results for input(s): CKTOTAL, CKMB, CKMBINDEX, TROPONINI in the last 168 hours.  BNP (last 3 results) No results for input(s): BNP in the last 8760 hours.  ProBNP (last 3 results) No results for input(s): PROBNP in the last 8760 hours.  Radiological Exams: No results found.  Assessment/Plan Active Problems:   Acute on chronic respiratory failure with hypoxia (HCC)   Congestive heart failure due to cardiomyopathy (HCC)   Chronic atrial flutter (HCC)   Severe sepsis with acute organ dysfunction due to Enterococcus species (HCC)   Acute renal failure due to tubular necrosis (HCC)   1. Acute on chronic respiratory failure hypoxia plan is to continue with the weaning process use PMV as tolerated 2. Congestive heart failure follow fluid status diuretics as tolerated 3. Chronic atrial flutter rate controlled 4. Severe sepsis resolved we will continue to monitor 5. Acute tubular necrosis following labs   I have personally seen and evaluated the patient, evaluated laboratory and imaging results, formulated the assessment and plan and placed orders. The Patient requires high complexity decision making with multiple systems involvement.  Rounds were done with the Respiratory Therapy Director and Staff therapists and discussed with nursing staff also.  Yevonne Pax, MD [facility]  Pulmonary Critical Care Medicine Sleep Medicine

## 2020-01-04 DIAGNOSIS — I4892 Unspecified atrial flutter: Secondary | ICD-10-CM | POA: Diagnosis not present

## 2020-01-04 DIAGNOSIS — I509 Heart failure, unspecified: Secondary | ICD-10-CM | POA: Diagnosis not present

## 2020-01-04 DIAGNOSIS — N170 Acute kidney failure with tubular necrosis: Secondary | ICD-10-CM | POA: Diagnosis not present

## 2020-01-04 DIAGNOSIS — J9621 Acute and chronic respiratory failure with hypoxia: Secondary | ICD-10-CM | POA: Diagnosis not present

## 2020-01-04 NOTE — Progress Notes (Signed)
Pulmonary Critical Care Medicine [facility] [place]   PULMONARY CRITICAL CARE SERVICE  PROGRESS NOTE  Date of Service: 01/04/2020  Sean Porter  [id number]  DOB: 09-26-1951   DOA: 12/19/2019  Referring Physician: Carron Curie, MD  HPI: Sean Porter is a 69 y.o. male seen for follow up of Acute on Chronic Respiratory Failure.  Patient at this time is on the T collar has been on 28% FiO2 good saturations are noted using the PMV  Medications: Reviewed on Rounds  Physical Exam:  Vitals: Temperature is 97.3 pulse 95 respiratory 24 blood pressure is 121/55 saturations 93%  Ventilator Settings currently on T collar FiO2 28%  . General: Comfortable at this time . Eyes: Grossly normal lids, irises & conjunctiva . ENT: grossly tongue is normal . Neck: no obvious mass . Cardiovascular: S1 S2 normal no gallop . Respiratory: No rhonchi coarse breath sounds . Abdomen: soft . Skin: no rash seen on limited exam . Musculoskeletal: not rigid . Psychiatric:unable to assess . Neurologic: no seizure no involuntary movements         Lab Data:   Basic Metabolic Panel: Recent Labs  Lab 12/29/19 0800 12/30/19 0726 01/01/20 0419 01/02/20 0605  NA 140 140 137 137  K 3.9 4.0 4.7 4.5  CL 97* 97* 94* 95*  CO2 35* 34* 34* 34*  GLUCOSE 203* 202* 206* 179*  BUN 40* 38* 19 17  CREATININE 1.41* 1.19 0.68 0.72  CALCIUM 9.4 9.4 9.2 9.4  MG  --   --  2.1 2.0  PHOS  --  2.9  --   --     ABG: No results for input(s): PHART, PCO2ART, PO2ART, HCO3, O2SAT in the last 168 hours.  Liver Function Tests: Recent Labs  Lab 12/30/19 0726  ALBUMIN 2.2*   No results for input(s): LIPASE, AMYLASE in the last 168 hours. No results for input(s): AMMONIA in the last 168 hours.  CBC: Recent Labs  Lab 12/31/19 1312 01/01/20 0419  WBC 6.3 8.1  HGB 10.3* 10.5*  HCT 34.5* 35.1*  MCV 94.5 93.4  PLT 259 233    Cardiac Enzymes: No results for input(s): CKTOTAL, CKMB, CKMBINDEX,  TROPONINI in the last 168 hours.  BNP (last 3 results) No results for input(s): BNP in the last 8760 hours.  ProBNP (last 3 results) No results for input(s): PROBNP in the last 8760 hours.  Radiological Exams: No results found.  Assessment/Plan Active Problems:   Acute on chronic respiratory failure with hypoxia (HCC)   Congestive heart failure due to cardiomyopathy (HCC)   Chronic atrial flutter (HCC)   Severe sepsis with acute organ dysfunction due to Enterococcus species (HCC)   Acute renal failure due to tubular necrosis (HCC)   1. Acute on chronic respiratory failure with hypoxia continue with the T collar and PMV will be advanced as tolerated. 2. Congestive heart failure due to cardiomyopathy we will continue to monitor 3. Chronic atrial fibrillation rate controlled 4. Severe sepsis due to Enterococcus treated we will continue to follow 5. Acute renal failure with tubular necrosis no change continue present management   I have personally seen and evaluated the patient, evaluated laboratory and imaging results, formulated the assessment and plan and placed orders. The Patient requires high complexity decision making with multiple systems involvement.  Rounds were done with the Respiratory Therapy Director and Staff therapists and discussed with nursing staff also.  Yevonne Pax, MD [facility] Pulmonary Critical Care Medicine Sleep Medicine

## 2020-01-05 DIAGNOSIS — I509 Heart failure, unspecified: Secondary | ICD-10-CM | POA: Diagnosis not present

## 2020-01-05 DIAGNOSIS — N170 Acute kidney failure with tubular necrosis: Secondary | ICD-10-CM | POA: Diagnosis not present

## 2020-01-05 DIAGNOSIS — I4892 Unspecified atrial flutter: Secondary | ICD-10-CM | POA: Diagnosis not present

## 2020-01-05 DIAGNOSIS — J9621 Acute and chronic respiratory failure with hypoxia: Secondary | ICD-10-CM | POA: Diagnosis not present

## 2020-01-05 LAB — BASIC METABOLIC PANEL
Anion gap: 9 (ref 5–15)
BUN: 24 mg/dL — ABNORMAL HIGH (ref 8–23)
CO2: 34 mmol/L — ABNORMAL HIGH (ref 22–32)
Calcium: 9.4 mg/dL (ref 8.9–10.3)
Chloride: 91 mmol/L — ABNORMAL LOW (ref 98–111)
Creatinine, Ser: 0.98 mg/dL (ref 0.61–1.24)
GFR calc Af Amer: >60 mL/min (ref >60)
GFR calc non Af Amer: >60 mL/min (ref >60)
Glucose, Bld: 196 mg/dL — ABNORMAL HIGH (ref 70–99)
Potassium: 4.8 mmol/L (ref 3.5–5.1)
Sodium: 134 mmol/L — ABNORMAL LOW (ref 135–145)

## 2020-01-05 LAB — CBC
HCT: 33.8 % — ABNORMAL LOW (ref 39.0–52.0)
Hemoglobin: 10.4 g/dL — ABNORMAL LOW (ref 13.0–17.0)
MCH: 27.8 pg (ref 26.0–34.0)
MCHC: 30.8 g/dL (ref 30.0–36.0)
MCV: 90.4 fL (ref 80.0–100.0)
Platelets: 260 10*3/uL (ref 150–400)
RBC: 3.74 MIL/uL — ABNORMAL LOW (ref 4.22–5.81)
RDW: 15.2 % (ref 11.5–15.5)
WBC: 8.3 10*3/uL (ref 4.0–10.5)
nRBC: 0 % (ref 0.0–0.2)

## 2020-01-05 LAB — MAGNESIUM: Magnesium: 2.2 mg/dL (ref 1.7–2.4)

## 2020-01-05 NOTE — Progress Notes (Addendum)
Pulmonary Critical Care Medicine [facility] [place]   PULMONARY CRITICAL CARE SERVICE  PROGRESS NOTE  Date of Service: 01/05/2020  Pearson Picou  [id number]:[id number]  DOB: [birth year]   DOA: 12/19/2019  Referring Physician: Carron Curie, MD  HPI: Sean Porter is a 69 y.o. male seen for follow up of Acute on Chronic Respiratory Failure.  Patient is on 20% trach collar satting well no fever or distress.  Medications: Reviewed on Rounds  Physical Exam:  Vitals: Pulse 89 respirations 28 BP 124/72 O2 sat 96% temp 97.6  Ventilator Settings 28% ATC  . General: Comfortable at this time . Eyes: Grossly normal lids, irises & conjunctiva . ENT: grossly tongue is normal . Neck: no obvious mass . Cardiovascular: S1 S2 normal no gallop . Respiratory: No rales or rhonchi noted . Abdomen: soft . Skin: no rash seen on limited exam . Musculoskeletal: not rigid . Psychiatric:unable to assess . Neurologic: no seizure no involuntary movements         Lab Data:   Basic Metabolic Panel: Recent Labs  Lab 12/30/19 0726 01/01/20 0419 01/02/20 0605 01/05/20 0612  NA 140 137 137 134*  K 4.0 4.7 4.5 4.8  CL 97* 94* 95* 91*  CO2 34* 34* 34* 34*  GLUCOSE 202* 206* 179* 196*  BUN 38* 19 17 24*  CREATININE 1.19 0.68 0.72 0.98  CALCIUM 9.4 9.2 9.4 9.4  MG  --  2.1 2.0 2.2  PHOS 2.9  --   --   --     ABG: No results for input(s): PHART, PCO2ART, PO2ART, HCO3, O2SAT in the last 168 hours.  Liver Function Tests: Recent Labs  Lab 12/30/19 0726  ALBUMIN 2.2*   No results for input(s): LIPASE, AMYLASE in the last 168 hours. No results for input(s): AMMONIA in the last 168 hours.  CBC: Recent Labs  Lab 12/31/19 1312 01/01/20 0419 01/05/20 0612  WBC 6.3 8.1 8.3  HGB 10.3* 10.5* 10.4*  HCT 34.5* 35.1* 33.8*  MCV 94.5 93.4 90.4  PLT 259 233 260    Cardiac Enzymes: No results for input(s): CKTOTAL, CKMB, CKMBINDEX, TROPONINI in the last 168 hours.  BNP (last 3 results) No  results for input(s): BNP in the last 8760 hours.  ProBNP (last 3 results) No results for input(s): PROBNP in the last 8760 hours.  Radiological Exams: No results found.  Assessment/Plan Active Problems:   Acute on chronic respiratory failure with hypoxia (HCC)   Congestive heart failure due to cardiomyopathy (HCC)   Chronic atrial flutter (HCC)   Severe sepsis with acute organ dysfunction due to Enterococcus species (HCC)   Acute renal failure due to tubular necrosis (HCC)   1. Acute on chronic respiratory failure with hypoxia continue with the T collar and PMV will be advanced as tolerated. 2. Congestive heart failure due to cardiomyopathy we will continue to monitor 3. Chronic atrial fibrillation rate controlled 4. Severe sepsis due to Enterococcus treated we will continue to follow 5. Acute renal failure with tubular necrosis no change continue present management   I have personally seen and evaluated the patient, evaluated laboratory and imaging results, formulated the assessment and plan and placed orders. The Patient requires high complexity decision making with multiple systems involvement.  Rounds were done with the Respiratory Therapy Director and Staff therapists and discussed with nursing staff also.  Yevonne Pax, MD [facility] Pulmonary Critical Care Medicine Sleep Medicine

## 2020-01-06 DIAGNOSIS — I509 Heart failure, unspecified: Secondary | ICD-10-CM | POA: Diagnosis not present

## 2020-01-06 DIAGNOSIS — I4892 Unspecified atrial flutter: Secondary | ICD-10-CM | POA: Diagnosis not present

## 2020-01-06 DIAGNOSIS — N170 Acute kidney failure with tubular necrosis: Secondary | ICD-10-CM | POA: Diagnosis not present

## 2020-01-06 DIAGNOSIS — J9621 Acute and chronic respiratory failure with hypoxia: Secondary | ICD-10-CM | POA: Diagnosis not present

## 2020-01-06 NOTE — Progress Notes (Addendum)
Pulmonary Critical Care Medicine [facility] [place]   PULMONARY CRITICAL CARE SERVICE  PROGRESS NOTE  Date of Service: 01/06/2020  Sean Porter  BJS:[id number]  DOB: 10-18-1951   DOA: 12/19/2019  Referring Physician: Carron Curie, MD  HPI: Sean Porter is a 69 y.o. male seen for follow up of Acute on Chronic Respiratory Failure.  Patient continues on 20% aerosol trach collar satting well no fever or distress  Medications: Reviewed on Rounds  Physical Exam:  Vitals: Pulse 74 respirations 30 BP 130/65 O2 sat 98% temp 97.0  Ventilator Settings 28% ATC  . General: Comfortable at this time . Eyes: Grossly normal lids, irises & conjunctiva . ENT: grossly tongue is normal . Neck: no obvious mass . Cardiovascular: S1 S2 normal no gallop . Respiratory: No rales or rhonchi noted . Abdomen: soft . Skin: no rash seen on limited exam . Musculoskeletal: not rigid . Psychiatric:unable to assess . Neurologic: no seizure no involuntary movements         Lab Data:   Basic Metabolic Panel: Recent Labs  Lab 01/01/20 0419 01/02/20 0605 01/05/20 0612  NA 137 137 134*  K 4.7 4.5 4.8  CL 94* 95* 91*  CO2 34* 34* 34*  GLUCOSE 206* 179* 196*  BUN 19 17 24*  CREATININE 0.68 0.72 0.98  CALCIUM 9.2 9.4 9.4  MG 2.1 2.0 2.2    ABG: No results for input(s): PHART, PCO2ART, PO2ART, HCO3, O2SAT in the last 168 hours.  Liver Function Tests: No results for input(s): AST, ALT, ALKPHOS, BILITOT, PROT, ALBUMIN in the last 168 hours. No results for input(s): LIPASE, AMYLASE in the last 168 hours. No results for input(s): AMMONIA in the last 168 hours.  CBC: Recent Labs  Lab 12/31/19 1312 01/01/20 0419 01/05/20 0612  WBC 6.3 8.1 8.3  HGB 10.3* 10.5* 10.4*  HCT 34.5* 35.1* 33.8*  MCV 94.5 93.4 90.4  PLT 259 233 260    Cardiac Enzymes: No results for input(s): CKTOTAL, CKMB, CKMBINDEX, TROPONINI in the last 168 hours.  BNP (last 3 results) No results for input(s): BNP in  the last 8760 hours.  ProBNP (last 3 results) No results for input(s): PROBNP in the last 8760 hours.  Radiological Exams: No results found.  Assessment/Plan Active Problems:   Acute on chronic respiratory failure with hypoxia (HCC)   Congestive heart failure due to cardiomyopathy (HCC)   Chronic atrial flutter (HCC)   Severe sepsis with acute organ dysfunction due to Enterococcus species (HCC)   Acute renal failure due to tubular necrosis (HCC)   1. Acute on chronic respiratory failure with hypoxia continue with the T collar and PMV will be advanced as tolerated. 2. Congestive heart failure due to cardiomyopathy we will continue to monitor 3. Chronic atrial fibrillation rate controlled 4. Severe sepsis due to Enterococcus treated we will continue to follow 5. Acute renal failure with tubular necrosis no change continue present management   I have personally seen and evaluated the patient, evaluated laboratory and imaging results, formulated the assessment and plan and placed orders. The Patient requires high complexity decision making with multiple systems involvement.  Rounds were done with the Respiratory Therapy Director and Staff therapists and discussed with nursing staff also.  Yevonne Pax, MD [facility] Pulmonary Critical Care Medicine Sleep Medicine

## 2020-01-07 DIAGNOSIS — I4892 Unspecified atrial flutter: Secondary | ICD-10-CM | POA: Diagnosis not present

## 2020-01-07 DIAGNOSIS — I509 Heart failure, unspecified: Secondary | ICD-10-CM | POA: Diagnosis not present

## 2020-01-07 DIAGNOSIS — N170 Acute kidney failure with tubular necrosis: Secondary | ICD-10-CM | POA: Diagnosis not present

## 2020-01-07 DIAGNOSIS — J9621 Acute and chronic respiratory failure with hypoxia: Secondary | ICD-10-CM | POA: Diagnosis not present

## 2020-01-07 NOTE — Progress Notes (Addendum)
Pulmonary Critical Care Medicine [facility] [place]   PULMONARY CRITICAL CARE SERVICE  PROGRESS NOTE  Date of Service: 01/07/2020  Sean Porter  [id number]:[id number]  DOB: 1950-11-17   DOA: 12/19/2019  Referring Physician: Carron Curie, MD  HPI: Sean Porter is a 69 y.o. male seen for follow up of Acute on Chronic Respiratory Failure.  Patient mains on 28% aerosol trach collar satting well no fever or distress  Medications: Reviewed on Rounds  Physical Exam:  Vitals: Pulse 82 respirations 28 BP 133/70 O2 sat 93% temp 98.1  Ventilator Settings ATC 28%  . General: Comfortable at this time . Eyes: Grossly normal lids, irises & conjunctiva . ENT: grossly tongue is normal . Neck: no obvious mass . Cardiovascular: S1 S2 normal no gallop . Respiratory: No rales or rhonchi noted . Abdomen: soft . Skin: no rash seen on limited exam . Musculoskeletal: not rigid . Psychiatric:unable to assess . Neurologic: no seizure no involuntary movements         Lab Data:   Basic Metabolic Panel: Recent Labs  Lab 01/01/20 0419 01/02/20 0605 01/05/20 0612  NA 137 137 134*  K 4.7 4.5 4.8  CL 94* 95* 91*  CO2 34* 34* 34*  GLUCOSE 206* 179* 196*  BUN 19 17 24*  CREATININE 0.68 0.72 0.98  CALCIUM 9.2 9.4 9.4  MG 2.1 2.0 2.2    ABG: No results for input(s): PHART, PCO2ART, PO2ART, HCO3, O2SAT in the last 168 hours.  Liver Function Tests: No results for input(s): AST, ALT, ALKPHOS, BILITOT, PROT, ALBUMIN in the last 168 hours. No results for input(s): LIPASE, AMYLASE in the last 168 hours. No results for input(s): AMMONIA in the last 168 hours.  CBC: Recent Labs  Lab 01/01/20 0419 01/05/20 0612  WBC 8.1 8.3  HGB 10.5* 10.4*  HCT 35.1* 33.8*  MCV 93.4 90.4  PLT 233 260    Cardiac Enzymes: No results for input(s): CKTOTAL, CKMB, CKMBINDEX, TROPONINI in the last 168 hours.  BNP (last 3 results) No results for input(s): BNP in the last 8760 hours.  ProBNP (last 3  results) No results for input(s): PROBNP in the last 8760 hours.  Radiological Exams: No results found.  Assessment/Plan Active Problems:   Acute on chronic respiratory failure with hypoxia (HCC)   Congestive heart failure due to cardiomyopathy (HCC)   Chronic atrial flutter (HCC)   Severe sepsis with acute organ dysfunction due to Enterococcus species (HCC)   Acute renal failure due to tubular necrosis (HCC)   1. Acute on chronic respiratory failure with hypoxia continue with the T collar and PMV will be advanced as tolerated. 2. Congestive heart failure due to cardiomyopathy we will continue to monitor 3. Chronic atrial fibrillation rate controlled 4. Severe sepsis due to Enterococcus treated we will continue to follow 5. Acute renal failure with tubular necrosis no change continue present management   I have personally seen and evaluated the patient, evaluated laboratory and imaging results, formulated the assessment and plan and placed orders. The Patient requires high complexity decision making with multiple systems involvement.  Rounds were done with the Respiratory Therapy Director and Staff therapists and discussed with nursing staff also.  Yevonne Pax, MD [facility] Pulmonary Critical Care Medicine Sleep Medicine

## 2020-01-08 DIAGNOSIS — N170 Acute kidney failure with tubular necrosis: Secondary | ICD-10-CM | POA: Diagnosis not present

## 2020-01-08 DIAGNOSIS — I509 Heart failure, unspecified: Secondary | ICD-10-CM | POA: Diagnosis not present

## 2020-01-08 DIAGNOSIS — J9621 Acute and chronic respiratory failure with hypoxia: Secondary | ICD-10-CM | POA: Diagnosis not present

## 2020-01-08 DIAGNOSIS — I4892 Unspecified atrial flutter: Secondary | ICD-10-CM | POA: Diagnosis not present

## 2020-01-08 NOTE — Progress Notes (Signed)
Pulmonary Critical Care Medicine [facility] [place]   PULMONARY CRITICAL CARE SERVICE  PROGRESS NOTE  Date of Service: 01/08/2020  Sean Porter  KZS:[id number]  DOB: 1951-02-06   DOA: 12/19/2019  Referring Physician: Carron Curie, MD  HPI: Sean Porter is a 69 y.o. male seen for follow up of Acute on Chronic Respiratory Failure.  Patient currently is on T collar has been on 28% FiO2 using PMV  Medications: Reviewed on Rounds  Physical Exam:  Vitals: Temperature is 97.4 pulse 89 respiratory rate 29 blood pressure is 121/58 saturations are 95%  Ventilator Settings on T collar with an FiO2 28%  . General: Comfortable at this time . Eyes: Grossly normal lids, irises & conjunctiva . ENT: grossly tongue is normal . Neck: no obvious mass . Cardiovascular: S1 S2 normal no gallop . Respiratory: No rhonchi coarse breath sounds . Abdomen: soft . Skin: no rash seen on limited exam . Musculoskeletal: not rigid . Psychiatric:unable to assess . Neurologic: no seizure no involuntary movements         Lab Data:   Basic Metabolic Panel: Recent Labs  Lab 01/02/20 0605 01/05/20 0612  NA 137 134*  K 4.5 4.8  CL 95* 91*  CO2 34* 34*  GLUCOSE 179* 196*  BUN 17 24*  CREATININE 0.72 0.98  CALCIUM 9.4 9.4  MG 2.0 2.2    ABG: No results for input(s): PHART, PCO2ART, PO2ART, HCO3, O2SAT in the last 168 hours.  Liver Function Tests: No results for input(s): AST, ALT, ALKPHOS, BILITOT, PROT, ALBUMIN in the last 168 hours. No results for input(s): LIPASE, AMYLASE in the last 168 hours. No results for input(s): AMMONIA in the last 168 hours.  CBC: Recent Labs  Lab 01/05/20 0612  WBC 8.3  HGB 10.4*  HCT 33.8*  MCV 90.4  PLT 260    Cardiac Enzymes: No results for input(s): CKTOTAL, CKMB, CKMBINDEX, TROPONINI in the last 168 hours.  BNP (last 3 results) No results for input(s): BNP in the last 8760 hours.  ProBNP (last 3 results) No results for input(s): PROBNP  in the last 8760 hours.  Radiological Exams: No results found.  Assessment/Plan Active Problems:   Acute on chronic respiratory failure with hypoxia (HCC)   Congestive heart failure due to cardiomyopathy (HCC)   Chronic atrial flutter (HCC)   Severe sepsis with acute organ dysfunction due to Enterococcus species (HCC)   Acute renal failure due to tubular necrosis (HCC)   1. Acute on chronic respiratory failure hypoxia continue with T collar trials using the PMV 2. Congestive heart failure compensated we will continue to monitor 3. Chronic atrial flutter rate is controlled 4. Severe sepsis supportive care 5. Acute renal failure following labs improvement   I have personally seen and evaluated the patient, evaluated laboratory and imaging results, formulated the assessment and plan and placed orders. The Patient requires high complexity decision making with multiple systems involvement.  Rounds were done with the Respiratory Therapy Director and Staff therapists and discussed with nursing staff also.  Yevonne Pax, MD [facility] Pulmonary Critical Care Medicine Sleep Medicine

## 2020-01-09 DIAGNOSIS — I4892 Unspecified atrial flutter: Secondary | ICD-10-CM | POA: Diagnosis not present

## 2020-01-09 DIAGNOSIS — N170 Acute kidney failure with tubular necrosis: Secondary | ICD-10-CM | POA: Diagnosis not present

## 2020-01-09 DIAGNOSIS — I509 Heart failure, unspecified: Secondary | ICD-10-CM | POA: Diagnosis not present

## 2020-01-09 DIAGNOSIS — J9621 Acute and chronic respiratory failure with hypoxia: Secondary | ICD-10-CM | POA: Diagnosis not present

## 2020-01-09 LAB — BASIC METABOLIC PANEL
Anion gap: 8 (ref 5–15)
BUN: 23 mg/dL (ref 8–23)
CO2: 35 mmol/L — ABNORMAL HIGH (ref 22–32)
Calcium: 9.4 mg/dL (ref 8.9–10.3)
Chloride: 91 mmol/L — ABNORMAL LOW (ref 98–111)
Creatinine, Ser: 0.98 mg/dL (ref 0.61–1.24)
GFR calc Af Amer: >60 mL/min (ref >60)
GFR calc non Af Amer: >60 mL/min (ref >60)
Glucose, Bld: 185 mg/dL — ABNORMAL HIGH (ref 70–99)
Potassium: 4.6 mmol/L (ref 3.5–5.1)
Sodium: 134 mmol/L — ABNORMAL LOW (ref 135–145)

## 2020-01-09 LAB — CBC
HCT: 35 % — ABNORMAL LOW (ref 39.0–52.0)
Hemoglobin: 10.5 g/dL — ABNORMAL LOW (ref 13.0–17.0)
MCH: 27.1 pg (ref 26.0–34.0)
MCHC: 30 g/dL (ref 30.0–36.0)
MCV: 90.4 fL (ref 80.0–100.0)
Platelets: 240 10*3/uL (ref 150–400)
RBC: 3.87 MIL/uL — ABNORMAL LOW (ref 4.22–5.81)
RDW: 15.1 % (ref 11.5–15.5)
WBC: 9.2 10*3/uL (ref 4.0–10.5)
nRBC: 0 % (ref 0.0–0.2)

## 2020-01-09 LAB — MAGNESIUM: Magnesium: 2.3 mg/dL (ref 1.7–2.4)

## 2020-01-09 NOTE — Progress Notes (Signed)
Pulmonary Critical Care Medicine [facility] [place]   PULMONARY CRITICAL CARE SERVICE  PROGRESS NOTE  Date of Service: 01/09/2020  Sean Porter  OMV:[id number]  DOB: 12-Nov-1950   DOA: 12/19/2019  Referring Physician: Carron Curie, MD  HPI: Sean Porter is a 69 y.o. male seen for follow up of Acute on Chronic Respiratory Failure.  Patient at this time is on T collar has actually been doing very well.  Respiratory therapy reports minimal secretions and could be a candidate for capping  Medications: Reviewed on Rounds  Physical Exam:  Vitals: Temperature is 97.4 pulse 91 respiratory 28 blood pressure is 143/66 saturations are 96%  Ventilator Settings on capping trials right now  . General: Comfortable at this time . Eyes: Grossly normal lids, irises & conjunctiva . ENT: grossly tongue is normal . Neck: no obvious mass . Cardiovascular: S1 S2 normal no gallop . Respiratory: No rhonchi no rales are noted at this time . Abdomen: soft . Skin: no rash seen on limited exam . Musculoskeletal: not rigid . Psychiatric:unable to assess . Neurologic: no seizure no involuntary movements         Lab Data:   Basic Metabolic Panel: Recent Labs  Lab 01/05/20 0612 01/09/20 0400  NA 134* 134*  K 4.8 4.6  CL 91* 91*  CO2 34* 35*  GLUCOSE 196* 185*  BUN 24* 23  CREATININE 0.98 0.98  CALCIUM 9.4 9.4  MG 2.2 2.3    ABG: No results for input(s): PHART, PCO2ART, PO2ART, HCO3, O2SAT in the last 168 hours.  Liver Function Tests: No results for input(s): AST, ALT, ALKPHOS, BILITOT, PROT, ALBUMIN in the last 168 hours. No results for input(s): LIPASE, AMYLASE in the last 168 hours. No results for input(s): AMMONIA in the last 168 hours.  CBC: Recent Labs  Lab 01/05/20 0612 01/09/20 0400  WBC 8.3 9.2  HGB 10.4* 10.5*  HCT 33.8* 35.0*  MCV 90.4 90.4  PLT 260 240    Cardiac Enzymes: No results for input(s): CKTOTAL, CKMB, CKMBINDEX, TROPONINI in the last 168  hours.  BNP (last 3 results) No results for input(s): BNP in the last 8760 hours.  ProBNP (last 3 results) No results for input(s): PROBNP in the last 8760 hours.  Radiological Exams: No results found.  Assessment/Plan Active Problems:   Acute on chronic respiratory failure with hypoxia (HCC)   Congestive heart failure due to cardiomyopathy (HCC)   Chronic atrial flutter (HCC)   Severe sepsis with acute organ dysfunction due to Enterococcus species (HCC)   Acute renal failure due to tubular necrosis (HCC)   1. Acute on chronic respiratory failure with hypoxia we will continue with capping trials titrate oxygen continue pulmonary toilet. 2. Chronic congestive heart failure is at baseline we will continue to follow 3. Chronic atrial fibrillation rate is controlled 4. Severe sepsis resolved 5. Acute renal failure due to tubular necrosis we will continue present management   I have personally seen and evaluated the patient, evaluated laboratory and imaging results, formulated the assessment and plan and placed orders. The Patient requires high complexity decision making with multiple systems involvement.  Rounds were done with the Respiratory Therapy Director and Staff therapists and discussed with nursing staff also.  Yevonne Pax, MD [facility] Pulmonary Critical Care Medicine Sleep Medicine

## 2020-01-10 DIAGNOSIS — I4892 Unspecified atrial flutter: Secondary | ICD-10-CM | POA: Diagnosis not present

## 2020-01-10 DIAGNOSIS — N170 Acute kidney failure with tubular necrosis: Secondary | ICD-10-CM | POA: Diagnosis not present

## 2020-01-10 DIAGNOSIS — I509 Heart failure, unspecified: Secondary | ICD-10-CM | POA: Diagnosis not present

## 2020-01-10 DIAGNOSIS — J9621 Acute and chronic respiratory failure with hypoxia: Secondary | ICD-10-CM | POA: Diagnosis not present

## 2020-01-10 NOTE — Progress Notes (Addendum)
Pulmonary Critical Care Medicine [facility] [place]   PULMONARY CRITICAL CARE SERVICE  PROGRESS NOTE  Date of Service: 01/10/2020  Sean Porter  YQM:[id number]  DOB: [birth year]   DOA: 12/19/2019  Referring Physician: Carron Curie, MD  HPI: Sean Porter is a 69 y.o. male seen for follow up of Acute on Chronic Respiratory Failure.  Patient remains capped on 1 L nasal cannula at this time for 48-hour goal.  Satting well no fever or distress.  Medications: Reviewed on Rounds  Physical Exam:  Vitals: Pulse 88 respirations 29 BP 110/66 O2 sat 98% temp 96.9  Ventilator Settings 1 L nasal cannula  . General: Comfortable at this time . Eyes: Grossly normal lids, irises & conjunctiva . ENT: grossly tongue is normal . Neck: no obvious mass . Cardiovascular: S1 S2 normal no gallop . Respiratory: No rales or rhonchi noted . Abdomen: soft . Skin: no rash seen on limited exam . Musculoskeletal: not rigid . Psychiatric:unable to assess . Neurologic: no seizure no involuntary movements         Lab Data:   Basic Metabolic Panel: Recent Labs  Lab 01/05/20 0612 01/09/20 0400  NA 134* 134*  K 4.8 4.6  CL 91* 91*  CO2 34* 35*  GLUCOSE 196* 185*  BUN 24* 23  CREATININE 0.98 0.98  CALCIUM 9.4 9.4  MG 2.2 2.3    ABG: No results for input(s): PHART, PCO2ART, PO2ART, HCO3, O2SAT in the last 168 hours.  Liver Function Tests: No results for input(s): AST, ALT, ALKPHOS, BILITOT, PROT, ALBUMIN in the last 168 hours. No results for input(s): LIPASE, AMYLASE in the last 168 hours. No results for input(s): AMMONIA in the last 168 hours.  CBC: Recent Labs  Lab 01/05/20 0612 01/09/20 0400  WBC 8.3 9.2  HGB 10.4* 10.5*  HCT 33.8* 35.0*  MCV 90.4 90.4  PLT 260 240    Cardiac Enzymes: No results for input(s): CKTOTAL, CKMB, CKMBINDEX, TROPONINI in the last 168 hours.  BNP (last 3 results) No results for input(s): BNP in the last 8760 hours.  ProBNP (last 3  results) No results for input(s): PROBNP in the last 8760 hours.  Radiological Exams: No results found.  Assessment/Plan Active Problems:   Acute on chronic respiratory failure with hypoxia (HCC)   Congestive heart failure due to cardiomyopathy (HCC)   Chronic atrial flutter (HCC)   Severe sepsis with acute organ dysfunction due to Enterococcus species (HCC)   Acute renal failure due to tubular necrosis (HCC)   1. Acute on chronic respiratory failure with hypoxia we will continue with capping trials titrate oxygen continue pulmonary toilet. 2. Chronic congestive heart failure is at baseline we will continue to follow 3. Chronic atrial fibrillation rate is controlled 4. Severe sepsis resolved 5. Acute renal failure due to tubular necrosis we will continue present management   I have personally seen and evaluated the patient, evaluated laboratory and imaging results, formulated the assessment and plan and placed orders. The Patient requires high complexity decision making with multiple systems involvement.  Rounds were done with the Respiratory Therapy Director and Staff therapists and discussed with nursing staff also.  Yevonne Pax, MD [facility] Pulmonary Critical Care Medicine Sleep Medicine

## 2020-01-11 DIAGNOSIS — J9621 Acute and chronic respiratory failure with hypoxia: Secondary | ICD-10-CM | POA: Diagnosis not present

## 2020-01-11 DIAGNOSIS — N170 Acute kidney failure with tubular necrosis: Secondary | ICD-10-CM | POA: Diagnosis not present

## 2020-01-11 DIAGNOSIS — I509 Heart failure, unspecified: Secondary | ICD-10-CM | POA: Diagnosis not present

## 2020-01-11 DIAGNOSIS — I4892 Unspecified atrial flutter: Secondary | ICD-10-CM | POA: Diagnosis not present

## 2020-01-11 LAB — BASIC METABOLIC PANEL
Anion gap: 9 (ref 5–15)
BUN: 26 mg/dL — ABNORMAL HIGH (ref 8–23)
CO2: 36 mmol/L — ABNORMAL HIGH (ref 22–32)
Calcium: 9.5 mg/dL (ref 8.9–10.3)
Chloride: 90 mmol/L — ABNORMAL LOW (ref 98–111)
Creatinine, Ser: 0.79 mg/dL (ref 0.61–1.24)
GFR calc Af Amer: >60 mL/min (ref >60)
GFR calc non Af Amer: >60 mL/min (ref >60)
Glucose, Bld: 164 mg/dL — ABNORMAL HIGH (ref 70–99)
Potassium: 4.3 mmol/L (ref 3.5–5.1)
Sodium: 135 mmol/L (ref 135–145)

## 2020-01-11 NOTE — Progress Notes (Signed)
Pulmonary Critical Care Medicine [facility] [place]   PULMONARY CRITICAL CARE SERVICE  PROGRESS NOTE  Date of Service: 01/11/2020  Ponce Skillman  OZD:[id number]  DOB: 23-Aug-1951   DOA: 12/19/2019  Referring Physician: Carron Curie, MD  HPI: Sean Porter is a 69 y.o. male seen for follow up of Acute on Chronic Respiratory Failure.  Patient is capping has been doing well now today will be 48 hours  Medications: Reviewed on Rounds  Physical Exam:  Vitals: Temperature is 97.5 pulse 69 respiratory rate 30 blood pressure is 130/70 saturations 96%  Ventilator Settings capping for 48 hours  . General: Comfortable at this time . Eyes: Grossly normal lids, irises & conjunctiva . ENT: grossly tongue is normal . Neck: no obvious mass . Cardiovascular: S1 S2 normal no gallop . Respiratory: No rhonchi coarse breath sounds . Abdomen: soft . Skin: no rash seen on limited exam . Musculoskeletal: not rigid . Psychiatric:unable to assess . Neurologic: no seizure no involuntary movements         Lab Data:   Basic Metabolic Panel: Recent Labs  Lab 01/05/20 0612 01/09/20 0400 01/11/20 0652  NA 134* 134* 135  K 4.8 4.6 4.3  CL 91* 91* 90*  CO2 34* 35* 36*  GLUCOSE 196* 185* 164*  BUN 24* 23 26*  CREATININE 0.98 0.98 0.79  CALCIUM 9.4 9.4 9.5  MG 2.2 2.3  --     ABG: No results for input(s): PHART, PCO2ART, PO2ART, HCO3, O2SAT in the last 168 hours.  Liver Function Tests: No results for input(s): AST, ALT, ALKPHOS, BILITOT, PROT, ALBUMIN in the last 168 hours. No results for input(s): LIPASE, AMYLASE in the last 168 hours. No results for input(s): AMMONIA in the last 168 hours.  CBC: Recent Labs  Lab 01/05/20 0612 01/09/20 0400  WBC 8.3 9.2  HGB 10.4* 10.5*  HCT 33.8* 35.0*  MCV 90.4 90.4  PLT 260 240    Cardiac Enzymes: No results for input(s): CKTOTAL, CKMB, CKMBINDEX, TROPONINI in the last 168 hours.  BNP (last 3 results) No results for input(s): BNP  in the last 8760 hours.  ProBNP (last 3 results) No results for input(s): PROBNP in the last 8760 hours.  Radiological Exams: No results found.  Assessment/Plan Active Problems:   Acute on chronic respiratory failure with hypoxia (HCC)   Congestive heart failure due to cardiomyopathy (HCC)   Chronic atrial flutter (HCC)   Severe sepsis with acute organ dysfunction due to Enterococcus species (HCC)   Acute renal failure due to tubular necrosis (HCC)   1. Acute on chronic respiratory failure with hypoxia we will continue with capping trials working towards eventual decannulation 2. Congestive heart failure stable at this time monitor fluid status 3. Chronic atrial flutter rate is controlled we will continue to follow 4. Severe sepsis due to Enterococcus treated improving 5. Acute renal failure due to tubular necrosis at baseline   I have personally seen and evaluated the patient, evaluated laboratory and imaging results, formulated the assessment and plan and placed orders. The Patient requires high complexity decision making with multiple systems involvement.  Rounds were done with the Respiratory Therapy Director and Staff therapists and discussed with nursing staff also.  Yevonne Pax, MD [facility] Pulmonary Critical Care Medicine Sleep Medicine

## 2020-01-12 DIAGNOSIS — J9621 Acute and chronic respiratory failure with hypoxia: Secondary | ICD-10-CM | POA: Diagnosis not present

## 2020-01-12 DIAGNOSIS — N170 Acute kidney failure with tubular necrosis: Secondary | ICD-10-CM | POA: Diagnosis not present

## 2020-01-12 DIAGNOSIS — I4892 Unspecified atrial flutter: Secondary | ICD-10-CM | POA: Diagnosis not present

## 2020-01-12 DIAGNOSIS — I509 Heart failure, unspecified: Secondary | ICD-10-CM | POA: Diagnosis not present

## 2020-01-12 NOTE — Progress Notes (Addendum)
Pulmonary Critical Care Medicine [facility] [place]   PULMONARY CRITICAL CARE SERVICE  PROGRESS NOTE  Date of Service: 01/12/2020  Sean Porter  VCB:[id number]  DOB: Dec 04, 1950   DOA: 12/19/2019  Referring Physician: Carron Curie, MD  HPI: Sean Porter is a 69 y.o. male seen for follow up of Acute on Chronic Respiratory Failure.  Patient remains capped at this time for 72 hours.  Satting well with no distress.  Medications: Reviewed on Rounds  Physical Exam:  Vitals: Pulse 92 respirations 27 BP 150/80 O2 sat 98% temp 98.7  Ventilator Settings capped on room air  . General: Comfortable at this time . Eyes: Grossly normal lids, irises & conjunctiva . ENT: grossly tongue is normal . Neck: no obvious mass . Cardiovascular: S1 S2 normal no gallop . Respiratory: No rales rhonchi noted . Abdomen: soft . Skin: no rash seen on limited exam . Musculoskeletal: not rigid . Psychiatric:unable to assess . Neurologic: no seizure no involuntary movements         Lab Data:   Basic Metabolic Panel: Recent Labs  Lab 01/09/20 0400 01/11/20 0652  NA 134* 135  K 4.6 4.3  CL 91* 90*  CO2 35* 36*  GLUCOSE 185* 164*  BUN 23 26*  CREATININE 0.98 0.79  CALCIUM 9.4 9.5  MG 2.3  --     ABG: No results for input(s): PHART, PCO2ART, PO2ART, HCO3, O2SAT in the last 168 hours.  Liver Function Tests: No results for input(s): AST, ALT, ALKPHOS, BILITOT, PROT, ALBUMIN in the last 168 hours. No results for input(s): LIPASE, AMYLASE in the last 168 hours. No results for input(s): AMMONIA in the last 168 hours.  CBC: Recent Labs  Lab 01/09/20 0400  WBC 9.2  HGB 10.5*  HCT 35.0*  MCV 90.4  PLT 240    Cardiac Enzymes: No results for input(s): CKTOTAL, CKMB, CKMBINDEX, TROPONINI in the last 168 hours.  BNP (last 3 results) No results for input(s): BNP in the last 8760 hours.  ProBNP (last 3 results) No results for input(s): PROBNP in the last 8760  hours.  Radiological Exams: No results found.  Assessment/Plan Active Problems:   Acute on chronic respiratory failure with hypoxia (HCC)   Congestive heart failure due to cardiomyopathy (HCC)   Chronic atrial flutter (HCC)   Severe sepsis with acute organ dysfunction due to Enterococcus species (HCC)   Acute renal failure due to tubular necrosis (HCC)   1. Acute on chronic respiratory failure with hypoxia we will continue with capping trials working towards eventual decannulation 2. Congestive heart failure stable at this time monitor fluid status 3. Chronic atrial flutter rate is controlled we will continue to follow 4. Severe sepsis due to Enterococcus treated improving 5. Acute renal failure due to tubular necrosis at baseline   I have personally seen and evaluated the patient, evaluated laboratory and imaging results, formulated the assessment and plan and placed orders. The Patient requires high complexity decision making with multiple systems involvement.  Rounds were done with the Respiratory Therapy Director and Staff therapists and discussed with nursing staff also.  Yevonne Pax, MD [facility] Pulmonary Critical Care Medicine Sleep Medicine

## 2020-01-13 ENCOUNTER — Other Ambulatory Visit (HOSPITAL_COMMUNITY): Payer: Medicare HMO

## 2020-01-13 DIAGNOSIS — N170 Acute kidney failure with tubular necrosis: Secondary | ICD-10-CM | POA: Diagnosis not present

## 2020-01-13 DIAGNOSIS — J9621 Acute and chronic respiratory failure with hypoxia: Secondary | ICD-10-CM | POA: Diagnosis not present

## 2020-01-13 DIAGNOSIS — I4892 Unspecified atrial flutter: Secondary | ICD-10-CM | POA: Diagnosis not present

## 2020-01-13 DIAGNOSIS — I509 Heart failure, unspecified: Secondary | ICD-10-CM | POA: Diagnosis not present

## 2020-01-13 NOTE — Progress Notes (Signed)
Pulmonary Critical Care Medicine [facility] [place]   PULMONARY CRITICAL CARE SERVICE  PROGRESS NOTE  Date of Service: 01/13/2020  Sean Porter  MOQ:[id number]  DOB: Aug 09, 1951   DOA: 12/19/2019  Referring Physician: Carron Curie, MD  HPI: Sean Porter is a 69 y.o. male seen for follow up of Acute on Chronic Respiratory Failure.  Patient at this time is capping has been on room air has been capping now for several days.  Respiratory therapy reports no significant secretions ready for decannulation  Medications: Reviewed on Rounds  Physical Exam:  Vitals: Temperature is 97.4 pulse 93 respiratory rate 23 blood pressure is 138/73 saturations are 97%  Ventilator Settings capping on room air  . General: Comfortable at this time . Eyes: Grossly normal lids, irises & conjunctiva . ENT: grossly tongue is normal . Neck: no obvious mass . Cardiovascular: S1 S2 normal no gallop . Respiratory: No rhonchi no rales are noted at this time . Abdomen: soft . Skin: no rash seen on limited exam . Musculoskeletal: not rigid . Psychiatric:unable to assess . Neurologic: no seizure no involuntary movements         Lab Data:   Basic Metabolic Panel: Recent Labs  Lab 01/09/20 0400 01/11/20 0652  NA 134* 135  K 4.6 4.3  CL 91* 90*  CO2 35* 36*  GLUCOSE 185* 164*  BUN 23 26*  CREATININE 0.98 0.79  CALCIUM 9.4 9.5  MG 2.3  --     ABG: No results for input(s): PHART, PCO2ART, PO2ART, HCO3, O2SAT in the last 168 hours.  Liver Function Tests: No results for input(s): AST, ALT, ALKPHOS, BILITOT, PROT, ALBUMIN in the last 168 hours. No results for input(s): LIPASE, AMYLASE in the last 168 hours. No results for input(s): AMMONIA in the last 168 hours.  CBC: Recent Labs  Lab 01/09/20 0400  WBC 9.2  HGB 10.5*  HCT 35.0*  MCV 90.4  PLT 240    Cardiac Enzymes: No results for input(s): CKTOTAL, CKMB, CKMBINDEX, TROPONINI in the last 168 hours.  BNP (last 3 results) No  results for input(s): BNP in the last 8760 hours.  ProBNP (last 3 results) No results for input(s): PROBNP in the last 8760 hours.  Radiological Exams: No results found.  Assessment/Plan Active Problems:   Acute on chronic respiratory failure with hypoxia (HCC)   Congestive heart failure due to cardiomyopathy (HCC)   Chronic atrial flutter (HCC)   Severe sepsis with acute organ dysfunction due to Enterococcus species (HCC)   Acute renal failure due to tubular necrosis (HCC)   1. Acute on chronic respiratory failure with hypoxia we will continue with the weaning and proceed to decannulation 2. Congestive heart failure compensated 3. Chronic atrial flutter rate is controlled 4. Severe sepsis due to Enterococcus stable has been treated 5. Acute renal failure following labs   I have personally seen and evaluated the patient, evaluated laboratory and imaging results, formulated the assessment and plan and placed orders. The Patient requires high complexity decision making with multiple systems involvement.  Rounds were done with the Respiratory Therapy Director and Staff therapists and discussed with nursing staff also.  Yevonne Pax, MD [facility] Pulmonary Critical Care Medicine Sleep Medicine

## 2020-01-14 DIAGNOSIS — I509 Heart failure, unspecified: Secondary | ICD-10-CM | POA: Diagnosis not present

## 2020-01-14 DIAGNOSIS — I4892 Unspecified atrial flutter: Secondary | ICD-10-CM | POA: Diagnosis not present

## 2020-01-14 DIAGNOSIS — J9621 Acute and chronic respiratory failure with hypoxia: Secondary | ICD-10-CM | POA: Diagnosis not present

## 2020-01-14 DIAGNOSIS — N170 Acute kidney failure with tubular necrosis: Secondary | ICD-10-CM | POA: Diagnosis not present

## 2020-01-14 LAB — BASIC METABOLIC PANEL
Anion gap: 11 (ref 5–15)
BUN: 29 mg/dL — ABNORMAL HIGH (ref 8–23)
CO2: 33 mmol/L — ABNORMAL HIGH (ref 22–32)
Calcium: 9.6 mg/dL (ref 8.9–10.3)
Chloride: 95 mmol/L — ABNORMAL LOW (ref 98–111)
Creatinine, Ser: 0.97 mg/dL (ref 0.61–1.24)
GFR calc Af Amer: >60 mL/min (ref >60)
GFR calc non Af Amer: >60 mL/min (ref >60)
Glucose, Bld: 181 mg/dL — ABNORMAL HIGH (ref 70–99)
Potassium: 4.5 mmol/L (ref 3.5–5.1)
Sodium: 139 mmol/L (ref 135–145)

## 2020-01-14 LAB — MAGNESIUM: Magnesium: 2.3 mg/dL (ref 1.7–2.4)

## 2020-01-14 LAB — CBC
HCT: 35.9 % — ABNORMAL LOW (ref 39.0–52.0)
Hemoglobin: 11.1 g/dL — ABNORMAL LOW (ref 13.0–17.0)
MCH: 27.8 pg (ref 26.0–34.0)
MCHC: 30.9 g/dL (ref 30.0–36.0)
MCV: 90 fL (ref 80.0–100.0)
Platelets: 297 10*3/uL (ref 150–400)
RBC: 3.99 MIL/uL — ABNORMAL LOW (ref 4.22–5.81)
RDW: 14.9 % (ref 11.5–15.5)
WBC: 8.8 10*3/uL (ref 4.0–10.5)
nRBC: 0 % (ref 0.0–0.2)

## 2020-01-14 NOTE — Progress Notes (Signed)
Pulmonary Critical Care Medicine [facility]   PULMONARY CRITICAL CARE SERVICE  PROGRESS NOTE  Date of Service: 01/14/2020  Sean Porter  OAC:[id number]  DOB: 04-04-1951   DOA: 12/19/2019  Referring Physician: Merton Border, MD  HPI: Sean Porter is a 69 y.o. male seen for follow up of Acute on Chronic Respiratory Failure.  Patient is decannulated yesterday right now is on 2 L of O2 doing fairly well  Medications: Reviewed on Rounds  Physical Exam:  Vitals: Temperature is 97.0 pulse 75 respiratory rate 30 blood pressure is 132/69 saturations 97%  Ventilator Settings on 2 L O2  . General: Comfortable at this time . Eyes: Grossly normal lids, irises & conjunctiva . ENT: grossly tongue is normal . Neck: no obvious mass . Cardiovascular: S1 S2 normal no gallop . Respiratory: No rhonchi no rales are noted at this time . Abdomen: soft . Skin: no rash seen on limited exam . Musculoskeletal: not rigid . Psychiatric:unable to assess . Neurologic: no seizure no involuntary movements         Lab Data:   Basic Metabolic Panel: Recent Labs  Lab 01/09/20 0400 01/11/20 0652 01/14/20 0553  NA 134* 135 139  K 4.6 4.3 4.5  CL 91* 90* 95*  CO2 35* 36* 33*  GLUCOSE 185* 164* 181*  BUN 23 26* 29*  CREATININE 0.98 0.79 0.97  CALCIUM 9.4 9.5 9.6  MG 2.3  --  2.3    ABG: No results for input(s): PHART, PCO2ART, PO2ART, HCO3, O2SAT in the last 168 hours.  Liver Function Tests: No results for input(s): AST, ALT, ALKPHOS, BILITOT, PROT, ALBUMIN in the last 168 hours. No results for input(s): LIPASE, AMYLASE in the last 168 hours. No results for input(s): AMMONIA in the last 168 hours.  CBC: Recent Labs  Lab 01/09/20 0400 01/14/20 0553  WBC 9.2 8.8  HGB 10.5* 11.1*  HCT 35.0* 35.9*  MCV 90.4 90.0  PLT 240 297    Cardiac Enzymes: No results for input(s): CKTOTAL, CKMB, CKMBINDEX, TROPONINI in the last 168 hours.  BNP (last 3 results) No results for  input(s): BNP in the last 8760 hours.  ProBNP (last 3 results) No results for input(s): PROBNP in the last 8760 hours.  Radiological Exams: CT ABDOMEN PELVIS WO CONTRAST  Result Date: 01/13/2020 CLINICAL DATA:  Increased drainage at former drain site. Percutaneous gastrojejunostomy tube placed 12/25/2019. History of gangrenous cholecystitis status post cholecystectomy and necrotizing pancreatitis. EXAM: CT ABDOMEN AND PELVIS WITHOUT CONTRAST TECHNIQUE: Multidetector CT imaging of the abdomen and pelvis was performed following the standard protocol without IV contrast. COMPARISON:  12/22/2019 CT abdomen/pelvis. FINDINGS: Lower chest: No significant pulmonary nodules or acute consolidative airspace disease. Coronary atherosclerosis. Trace pericardial effusion/thickening is unchanged. Hepatobiliary: Normal liver size. No liver mass. Cholecystectomy. No biliary ductal dilatation. Pancreas: Ill-defined gas and fluid throughout the pancreatic head and neck, not substantially changed. Peripancreatic fat stranding is similar. No pancreatic duct dilation. Spleen: Normal size. No mass. Adrenals/Urinary Tract: Normal adrenals. No renal stones. No hydronephrosis. No contour deforming renal masses. Normal bladder. Stomach/Bowel: Percutaneous gastrostomy tube terminates in the proximal jejunum within the left upper quadrant. Air-fluid level in the stomach with no definite gastric wall thickening. Normal caliber small bowel with no small bowel wall thickening. Normal appendix. Marked left colonic diverticulosis with no definite large bowel wall thickening. Vascular/Lymphatic: Atherosclerotic nonaneurysmal abdominal aorta. No pathologically enlarged lymph nodes in the abdomen or pelvis. Reproductive: Normal size prostate. Other: No pneumoperitoneum. Right lower quadrant retroperitoneal  5.6 x 2.7 cm thick walled collection (series 4/image 59), mildly increased from 5.3 x 2.3 cm. Thick-walled irregular 9.8 x 3.4 cm central  mesenteric/posterior retroperitoneal collection (series 4/image 50), mildly increased from 9.1 x 2.9 cm, decreased in attenuation in the interval. Small volume perihepatic ascites is unchanged. No new focal fluid collections. Musculoskeletal: No aggressive appearing focal osseous lesions. Renal osteodystrophy. Marked thoracolumbar spondylosis. IMPRESSION: 1. Thick walled inflammatory collections in the right lower retroperitoneum and central mesentery/posterior retroperitoneum have mildly increased in size in the interval, as detailed. 2. Small volume perihepatic ascites is unchanged. 3. Percutaneous gastrostomy tube terminates in the proximal jejunum within the left upper quadrant. No evidence of bowel obstruction or acute bowel inflammation. 4. Marked left colonic diverticulosis. 5. Coronary atherosclerosis. 6. Aortic Atherosclerosis (ICD10-I70.0). Electronically Signed   By: Delbert Phenix M.D.   On: 01/13/2020 19:14    Assessment/Plan Active Problems:   Acute on chronic respiratory failure with hypoxia (HCC)   Congestive heart failure due to cardiomyopathy (HCC)   Chronic atrial flutter (HCC)   Severe sepsis with acute organ dysfunction due to Enterococcus species (HCC)   Acute renal failure due to tubular necrosis (HCC)   1. Acute on chronic respiratory failure with hypoxia we will continue with supportive care oxygen as necessary 2. Congestive heart failure compensated 3. Chronic atrial flutter rate controlled 4. Severe sepsis resolved 5. Acute renal failure continue present management   I have personally seen and evaluated the patient, evaluated laboratory and imaging results, formulated the assessment and plan and placed orders. The Patient requires high complexity decision making with multiple systems involvement.  Rounds were done with the Respiratory Therapy Director and Staff therapists and discussed with nursing staff also.  Yevonne Pax, MD [facility] Pulmonary Critical Care  Medicine Sleep Medicine

## 2020-01-15 NOTE — Progress Notes (Signed)
Patient ID: Sean Porter, male   DOB: 06-19-1951, 69 y.o.   MRN: [id number]   Pt with Hx gangrenous cholecystitis and necrotizing pancreatitis Came to [facility] 12/19/19   Known to IR :  Exchanged GJ tube 12/25/19  Pt had come to [facility] with LUQ abscess drain in place ? Date of placement Has since been removed per Merril Abbe [facility] Apparently drain was mostly dislodged and sutures were causing skin break down anyway. Was not replaced. Ostomy bag was placed over site to catch any OP at all OP from site has been little to none Until this weekend when 600 cc was reported as collected into ostomy bag. OP is greenish color  Wbc wnl; Afeb Denies pain I have seen and examined pt Ostomy bag is intact and over site at LUQ OP is minimal in bag now-- clear to greenish appearing fluid in bag No infection at site No redness or pain  CT 01/13/20:  IMPRESSION: 1. Thick walled inflammatory collections in the right lower retroperitoneum and central mesentery/posterior retroperitoneum have mildly increased in size in the interval, as detailed. 2. Small volume perihepatic ascites is unchanged. 3. Percutaneous gastrostomy tube terminates in the proximal jejunum within the left upper quadrant. No evidence of bowel obstruction or acute bowel inflammation.   Discussed with Dr Deanne Coffer He has reviewed imaging He recommends NO placement of new drains at this time Consider GI consult  Discussed with Merril Abbe [facility] She is agreeable

## 2020-01-16 ENCOUNTER — Other Ambulatory Visit (HOSPITAL_COMMUNITY): Payer: Medicare HMO

## 2020-01-16 LAB — MAGNESIUM: Magnesium: 2.4 mg/dL (ref 1.7–2.4)

## 2020-01-16 LAB — RENAL FUNCTION PANEL
Albumin: 2.2 g/dL — ABNORMAL LOW (ref 3.5–5.0)
Anion gap: 11 (ref 5–15)
BUN: 28 mg/dL — ABNORMAL HIGH (ref 8–23)
CO2: 35 mmol/L — ABNORMAL HIGH (ref 22–32)
Calcium: 9.5 mg/dL (ref 8.9–10.3)
Chloride: 93 mmol/L — ABNORMAL LOW (ref 98–111)
Creatinine, Ser: 0.93 mg/dL (ref 0.61–1.24)
GFR calc Af Amer: >60 mL/min (ref >60)
GFR calc non Af Amer: >60 mL/min (ref >60)
Glucose, Bld: 195 mg/dL — ABNORMAL HIGH (ref 70–99)
Phosphorus: 4.2 mg/dL (ref 2.5–4.6)
Potassium: 4.2 mmol/L (ref 3.5–5.1)
Sodium: 139 mmol/L (ref 135–145)

## 2020-01-16 LAB — CBC
HCT: 37.3 % — ABNORMAL LOW (ref 39.0–52.0)
Hemoglobin: 11.3 g/dL — ABNORMAL LOW (ref 13.0–17.0)
MCH: 27.7 pg (ref 26.0–34.0)
MCHC: 30.3 g/dL (ref 30.0–36.0)
MCV: 91.4 fL (ref 80.0–100.0)
Platelets: 306 10*3/uL (ref 150–400)
RBC: 4.08 MIL/uL — ABNORMAL LOW (ref 4.22–5.81)
RDW: 15 % (ref 11.5–15.5)
WBC: 10 10*3/uL (ref 4.0–10.5)
nRBC: 0 % (ref 0.0–0.2)

## 2020-01-17 LAB — URINALYSIS, ROUTINE W REFLEX MICROSCOPIC
Bilirubin Urine: NEGATIVE
Glucose, UA: NEGATIVE mg/dL
Hgb urine dipstick: NEGATIVE
Ketones, ur: NEGATIVE mg/dL
Leukocytes,Ua: NEGATIVE
Nitrite: NEGATIVE
Protein, ur: 30 mg/dL — AB
Specific Gravity, Urine: 1.024 (ref 1.005–1.030)
pH: 5 (ref 5.0–8.0)

## 2020-01-17 LAB — PROCALCITONIN: Procalcitonin: <0.1 ng/mL

## 2020-01-17 LAB — EXPECTORATED SPUTUM ASSESSMENT W GRAM STAIN, RFLX TO RESP C

## 2020-01-18 LAB — BASIC METABOLIC PANEL
Anion gap: 11 (ref 5–15)
BUN: 43 mg/dL — ABNORMAL HIGH (ref 8–23)
CO2: 33 mmol/L — ABNORMAL HIGH (ref 22–32)
Calcium: 9.1 mg/dL (ref 8.9–10.3)
Chloride: 94 mmol/L — ABNORMAL LOW (ref 98–111)
Creatinine, Ser: 1.09 mg/dL (ref 0.61–1.24)
GFR calc Af Amer: >60 mL/min (ref >60)
GFR calc non Af Amer: >60 mL/min (ref >60)
Glucose, Bld: 218 mg/dL — ABNORMAL HIGH (ref 70–99)
Potassium: 4.4 mmol/L (ref 3.5–5.1)
Sodium: 138 mmol/L (ref 135–145)

## 2020-01-18 LAB — CBC
HCT: 35.7 % — ABNORMAL LOW (ref 39.0–52.0)
Hemoglobin: 10.7 g/dL — ABNORMAL LOW (ref 13.0–17.0)
MCH: 26.6 pg (ref 26.0–34.0)
MCHC: 30 g/dL (ref 30.0–36.0)
MCV: 88.6 fL (ref 80.0–100.0)
Platelets: 299 10*3/uL (ref 150–400)
RBC: 4.03 MIL/uL — ABNORMAL LOW (ref 4.22–5.81)
RDW: 15 % (ref 11.5–15.5)
WBC: 10.7 10*3/uL — ABNORMAL HIGH (ref 4.0–10.5)
nRBC: 0 % (ref 0.0–0.2)

## 2020-01-18 LAB — MAGNESIUM: Magnesium: 2.3 mg/dL (ref 1.7–2.4)

## 2020-01-18 LAB — PHOSPHORUS: Phosphorus: 3.4 mg/dL (ref 2.5–4.6)

## 2020-01-19 ENCOUNTER — Other Ambulatory Visit (HOSPITAL_COMMUNITY): Payer: Medicare HMO

## 2020-01-19 LAB — URINE CULTURE

## 2020-01-19 NOTE — Progress Notes (Signed)
PROGRESS NOTE    Sean Porter  WYO:[id number] DOB: [birth year] DOA: 12/19/2019  Brief Narrative:  Sean Porter is an 69 y.o. male with history of congestive heart failure, diabetes mellitus, hypertension, tobacco abuse who initially presented to [facility] on 11/19/2019 with abdominal pain.  Was noted to be hypotensive with free air noted on the abdominal imaging.  He had emergent exploratory laparotomy where he was found to have gangrenous cholecystitis with pancreatitis and infected pancreatic bed and necrosis.  He had the gallbladder removed and the necrotic pancreas debrided.  Initially he was in septic shock required pressors for shock and remained ventilated postoperatively.  On postoperative day 1 he developed atrial flutter and required amiodarone.  He was weaned from pressors on day 5 but intermittently required pressors after that.  He was treated with antibiotic therapy with meropenem and vancomycin for 5 days.  His blood cultures were negative but his wound showed Enterococcus faecalis and he was switched to Zosyn.  Previously he had 2 JP drains and an [place] drain with wound VAC.  He currently has midline abdominal dressing with drain in place. He underwent PEG tube and trach placement on 12/04/2019 for prolonged respiratory failure and need for nutrition.  Due to his complex medical problems he was transferred to [facility] for further management.  He was weaned from his TPN.  Tentative plan was to treat him with total 4 weeks of Zosyn.  He was noted to have increased output from the drain site.  CT of the abdomen and pelvis done on 01/13/2020 per report thick-walled inflammatory collections in the right lower retroperitoneum and central mesentery/posterior retroperitoneum mildly increased in size.  Marked left colonic diverticulosis.  He also had fever.  He had sputum cultures collected on 01/17/2020 that showed moderate Stenotrophomonas maltophilia.  Urine cultures reportedly  showed multiple species.  He also had abdominal fluid cultures which per report showing abundant gram-negative rods, moderate gram-positive rods, moderate gram-positive cocci in pairs and clusters.  Final cultures are still pending at this time.  He has been started to IV meropenem, Flagyl.  He has some purulence draining from around the feeding tube site.   Assessment & Plan:   Active Problems:   Acute on chronic respiratory failure with hypoxia (HCC)   Congestive heart failure due to cardiomyopathy (HCC)   Chronic atrial flutter (HCC)   Severe sepsis with acute organ dysfunction due to Enterococcus species (HCC)   Acute renal failure due to tubular necrosis (HCC) Gangrenous cholecystitis with pancreatic necrosis Status post exploratory laparotomy with postoperative abdominal wound Abdominal abscess Protein calorie malnutrition Dysphagia Vomiting Diabetes mellitus type 2  Acute on chronic respiratory failure with hypoxemia: Patient with recent expiratory laparotomy and surgery.  Postoperatively he had respiratory failure.  He is status post decannulation.  He has been treated with Zosyn for necrotic pancreatitis secondary to ruptured gangrenous gallbladder.  He also was treated with Bactrim. He had fever.  His respiratory cultures from 01/17/2020 per report Stenotrophomonas maltophilia.  His respiratory is appears to be stable.  For now continue treatment with meropenem, Flagyl.  However, if he is not improving or if his respiratory status starts worsening recommend to start Levaquin for the stenotrophomonas.  Severe sepsis: Patient previously had septic shock at the outside facility secondary to ruptured gallbladder from gangrenous cholecystitis and pancreatic necrosis.    Received treatment with IV Zosyn.  He had fever therefore repeat pancultures ordered.  Respirate culture showing moderate Stenotrophomonas maltophilia.  Abdominal drain fluid  cultures showing abundant gram-negative rods,  moderate gram-positive rods, moderate gram-positive cocci in pairs and clusters.  Currently switched to IV meropenem, Flagyl.  Follow-up on the final cultures.  Gangrenous cholecystitis with pancreatic necrosis: Status post expiratory laparotomy. He has a postoperative abdominal wound with dressing in place.  Continue local wound care.  He has been treated with IV Zosyn.  However, he had fevers with increased drainage therefore switched to meropenem, Flagyl.  He had a CT scan done on 01/13/2020 which per report showing thick-walled inflammatory collections in the right lower lateral peritoneum and central mesentery/posterior retroperitoneum mildly increased in the interval.  He is also having some purulent drainage from around the feeding tube site.  He may need another CT preferably with IV contrast to better evaluate.  However, his creatinine today was a little elevated. Continue to monitor.  Dysphagia: Due to his dysphagia he is high risk for aspiration and worsening respiratory failure secondary to aspiration pneumonia.  Vomiting: KUB in the past did not show any acute findings.  However, he is very high risk for ileus. Further management per the primary team.  Diabetes mellitus type 2: Continue to monitor Accu-Cheks, further management of diabetes per the primary team.  He will need proper glycemic control in order to enable wound healing.  Protein calorie malnutrition: On tube feeds.  Further management per primary team.  Congestive heart failure: At this time appears to be compensated.  Continue medications and management per primary team.  However, he is very high risk for decompensated CHF.  Monitor closely.  Chronic atrial flutter: Continue medications per primary team.  Currently rate controlled.  Acute renal failure: Patient at the outside facility also had acute renal failure. He is high risk for worsening renal failure.  Creatinine today elevated.  On IV hydration per primary  team. Continue to monitor BUN/creatinine closely especially while on antibiotics and adjust dose accordingly.  Avoid nephrotoxic medication.  Due to his complex medical problems he is high risk for worsening and decompensation.  Plan of care discussed with the primary team.   Subjective: He had fever, CT imaging showing that the collections in the right lower retroperitoneum and central mesentery/posterior retroperitoneum have mildly increased in size compared to previous.  Is also having some purulent drainage from around the feeding tube site.  He still nonverbal.  Objective: Vitals: Temperature 99.7, pulse 105, respiratory rate 30, blood pressure 123/59, pulse oximetry 94% on oxygen nasal cannula.  Examination: General exam: Ill-appearing male, awake but not following commands Respiratory system: Occasional rhonchi, no wheezing Cardiovascular system: S1 & S2 heard, no murmur.   Gastrointestinal system: Dressing in place, has purulent drainage from around the PEG tube, ostomy. Central nervous system: He is not following any commands.  Unable to do a neurologic exam at this time. Extremities: No edema Skin: No rashes Psychiatry: Unable to assess at this time.    Data Reviewed: I have personally reviewed following labs and imaging studies  CBC: Recent Labs  Lab 01/14/20 0553 01/16/20 0414 01/18/20 0740  WBC 8.8 10.0 10.7*  HGB 11.1* 11.3* 10.7*  HCT 35.9* 37.3* 35.7*  MCV 90.0 91.4 88.6  PLT 297 306 381   Basic Metabolic Panel: Recent Labs  Lab 01/14/20 0553 01/16/20 0414 01/18/20 0740  NA 139 139 138  K 4.5 4.2 4.4  CL 95* 93* 94*  CO2 33* 35* 33*  GLUCOSE 181* 195* 218*  BUN 29* 28* 43*  CREATININE 0.97 0.93 1.09  CALCIUM 9.6 9.5 9.1  MG 2.3 2.4 2.3  PHOS  --  4.2 3.4   GFR: CrCl cannot be calculated (Unknown ideal weight.). Liver Function Tests: Recent Labs  Lab 01/16/20 0414  ALBUMIN 2.2*   No results for input(s): LIPASE, AMYLASE in the last 168  hours. No results for input(s): AMMONIA in the last 168 hours. Coagulation Profile: No results for input(s): INR, PROTIME in the last 168 hours. Cardiac Enzymes: No results for input(s): CKTOTAL, CKMB, CKMBINDEX, TROPONINI in the last 168 hours. BNP (last 3 results) No results for input(s): PROBNP in the last 8760 hours. HbA1C: No results for input(s): HGBA1C in the last 72 hours. CBG: No results for input(s): GLUCAP in the last 168 hours. Lipid Profile: No results for input(s): CHOL, HDL, LDLCALC, TRIG, CHOLHDL, LDLDIRECT in the last 72 hours. Thyroid Function Tests: No results for input(s): TSH, T4TOTAL, FREET4, T3FREE, THYROIDAB in the last 72 hours. Anemia Panel: No results for input(s): VITAMINB12, FOLATE, FERRITIN, TIBC, IRON, RETICCTPCT in the last 72 hours. Sepsis Labs: Recent Labs  Lab 01/17/20 1115  PROCALCITON <0.10    Recent Results (from the past 240 hour(s))  Expectorated sputum assessment w rflx to resp cult     Status: None   Collection Time: 01/17/20 11:35 AM   Specimen: Expectorated Sputum  Result Value Ref Range Status   Specimen Description EXPECTORATED SPUTUM  Final   Special Requests NONE  Final   Sputum evaluation   Final    THIS SPECIMEN IS ACCEPTABLE FOR SPUTUM CULTURE Performed at [facility], [address], [place], [place] [address]    Report Status 01/17/2020 FINAL  Final  Culture, respiratory     Status: None (Preliminary result)   Collection Time: 01/17/20 11:35 AM  Result Value Ref Range Status   Specimen Description EXPECTORATED SPUTUM  Final   Special Requests NONE Reflexed from [id number]  Final   Gram Stain   Final    FEW WBC PRESENT,BOTH PMN AND MONONUCLEAR FEW GRAM POSITIVE COCCI IN PAIRS IN CLUSTERS    Culture   Final    MODERATE STENOTROPHOMONAS MALTOPHILIA CULTURE REINCUBATED FOR BETTER GROWTH Performed at [facility], [address] [place], [place] [address]    Report Status PENDING  Incomplete  Culture, Urine      Status: Abnormal   Collection Time: 01/17/20  5:45 PM   Specimen: Urine, Random  Result Value Ref Range Status   Specimen Description URINE, RANDOM  Final   Special Requests   Final    NONE Performed at [facility], [address], [place], [place] [address]    Culture MULTIPLE SPECIES PRESENT, SUGGEST RECOLLECTION (A)  Final   Report Status 01/19/2020 FINAL  Final  Aerobic/Anaerobic Culture (surgical/deep wound)     Status: None (Preliminary result)   Collection Time: 01/18/20  9:51 PM   Specimen: Abdomen  Result Value Ref Range Status   Specimen Description ABDOMEN  Final   Special Requests DRAIN  Final   Gram Stain   Final    NO WBC SEEN ABUNDANT GRAM NEGATIVE RODS MODERATE GRAM POSITIVE RODS MODERATE GRAM POSITIVE COCCI IN PAIRS IN CLUSTERS    Culture   Final    TOO YOUNG TO READ Performed at [facility], [address] [place], [place] [address]    Report Status PENDING  Incomplete         Radiology Studies: DG Abd 1 View  Result Date: 01/19/2020 CLINICAL DATA:  Nausea and vomiting. EXAM:  ABDOMEN - 1 VIEW COMPARISON:  Abdominal radiograph dated 12/21/2019 and CT scan of the abdomen dated 01/13/2020 FINDINGS: Percutaneous gastrostomy tube with a feeding tube extending into the jejunum, unchanged. No dilatation of the bowel. Small amount of residual contrast in the distal colon from the prior CT scan. No acute bone abnormality. IMPRESSION: No appreciable acute abnormalities. Electronically Signed   By: Francene Boyers M.D.   On: 01/19/2020 12:08    Scheduled Meds: Please see MAR  Vonzella Nipple, MD  01/19/2020, 4:14 PM

## 2020-01-20 ENCOUNTER — Other Ambulatory Visit (HOSPITAL_COMMUNITY): Payer: Medicare HMO

## 2020-01-20 LAB — BASIC METABOLIC PANEL
Anion gap: 12 (ref 5–15)
BUN: 48 mg/dL — ABNORMAL HIGH (ref 8–23)
CO2: 32 mmol/L (ref 22–32)
Calcium: 9.3 mg/dL (ref 8.9–10.3)
Chloride: 99 mmol/L (ref 98–111)
Creatinine, Ser: 1 mg/dL (ref 0.61–1.24)
GFR calc Af Amer: >60 mL/min (ref >60)
GFR calc non Af Amer: >60 mL/min (ref >60)
Glucose, Bld: 197 mg/dL — ABNORMAL HIGH (ref 70–99)
Potassium: 4.6 mmol/L (ref 3.5–5.1)
Sodium: 143 mmol/L (ref 135–145)

## 2020-01-20 LAB — PHOSPHORUS: Phosphorus: 4.3 mg/dL (ref 2.5–4.6)

## 2020-01-20 LAB — CBC
HCT: 36.3 % — ABNORMAL LOW (ref 39.0–52.0)
Hemoglobin: 10.7 g/dL — ABNORMAL LOW (ref 13.0–17.0)
MCH: 27 pg (ref 26.0–34.0)
MCHC: 29.5 g/dL — ABNORMAL LOW (ref 30.0–36.0)
MCV: 91.7 fL (ref 80.0–100.0)
Platelets: 337 10*3/uL (ref 150–400)
RBC: 3.96 MIL/uL — ABNORMAL LOW (ref 4.22–5.81)
RDW: 15.2 % (ref 11.5–15.5)
WBC: 9.5 10*3/uL (ref 4.0–10.5)
nRBC: 0 % (ref 0.0–0.2)

## 2020-01-20 LAB — MAGNESIUM: Magnesium: 2.6 mg/dL — ABNORMAL HIGH (ref 1.7–2.4)

## 2020-01-21 ENCOUNTER — Other Ambulatory Visit (HOSPITAL_COMMUNITY): Payer: Medicare HMO

## 2020-01-21 LAB — AEROBIC/ANAEROBIC CULTURE W GRAM STAIN (SURGICAL/DEEP WOUND): Gram Stain: NONE SEEN

## 2020-01-21 LAB — CULTURE, RESPIRATORY W GRAM STAIN

## 2020-01-21 LAB — URINE CULTURE: Culture: NO GROWTH

## 2020-01-22 LAB — BASIC METABOLIC PANEL
Anion gap: 10 (ref 5–15)
BUN: 41 mg/dL — ABNORMAL HIGH (ref 8–23)
CO2: 35 mmol/L — ABNORMAL HIGH (ref 22–32)
Calcium: 9.3 mg/dL (ref 8.9–10.3)
Chloride: 101 mmol/L (ref 98–111)
Creatinine, Ser: 0.83 mg/dL (ref 0.61–1.24)
GFR calc Af Amer: >60 mL/min (ref >60)
GFR calc non Af Amer: >60 mL/min (ref >60)
Glucose, Bld: 235 mg/dL — ABNORMAL HIGH (ref 70–99)
Potassium: 5.1 mmol/L (ref 3.5–5.1)
Sodium: 146 mmol/L — ABNORMAL HIGH (ref 135–145)

## 2020-01-22 LAB — CBC
HCT: 40.3 % (ref 39.0–52.0)
Hemoglobin: 11.2 g/dL — ABNORMAL LOW (ref 13.0–17.0)
MCH: 26.2 pg (ref 26.0–34.0)
MCHC: 27.8 g/dL — ABNORMAL LOW (ref 30.0–36.0)
MCV: 94.4 fL (ref 80.0–100.0)
Platelets: 350 10*3/uL (ref 150–400)
RBC: 4.27 MIL/uL (ref 4.22–5.81)
RDW: 15.5 % (ref 11.5–15.5)
WBC: 9.1 10*3/uL (ref 4.0–10.5)
nRBC: 0 % (ref 0.0–0.2)

## 2020-01-22 LAB — PHOSPHORUS: Phosphorus: 3.5 mg/dL (ref 2.5–4.6)

## 2020-01-22 LAB — MAGNESIUM: Magnesium: 2.6 mg/dL — ABNORMAL HIGH (ref 1.7–2.4)

## 2020-01-22 NOTE — Progress Notes (Addendum)
Patient ID: Farooq Petrovich, male   DOB: December 11, 1950, 69 y.o.   MRN: [id number]   Re request for drain of abdominal collection  Was originally requested 01/15/20--- see notes Attestation signed by Oley Balm, MD at 01/15/2020 2:40 PM  RLQ retropertineal collections are stable.  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   Consider GI consultation    New request 3/21--- Dr Archer Asa reviewed imaging Again- No drain per [facility], favors pseudocysts.  Per Waylan Boga, pt seems ot be responding to abx.  Will DC order now-- can re order if feel appropriate; monitor symptoms and labs

## 2020-01-23 LAB — BASIC METABOLIC PANEL
Anion gap: 7 (ref 5–15)
BUN: 45 mg/dL — ABNORMAL HIGH (ref 8–23)
CO2: 36 mmol/L — ABNORMAL HIGH (ref 22–32)
Calcium: 8.8 mg/dL — ABNORMAL LOW (ref 8.9–10.3)
Chloride: 102 mmol/L (ref 98–111)
Creatinine, Ser: 0.97 mg/dL (ref 0.61–1.24)
GFR calc Af Amer: >60 mL/min (ref >60)
GFR calc non Af Amer: >60 mL/min (ref >60)
Glucose, Bld: 218 mg/dL — ABNORMAL HIGH (ref 70–99)
Potassium: 4.6 mmol/L (ref 3.5–5.1)
Sodium: 145 mmol/L (ref 135–145)

## 2020-01-25 LAB — CBC
HCT: 39 % (ref 39.0–52.0)
Hemoglobin: 11.4 g/dL — ABNORMAL LOW (ref 13.0–17.0)
MCH: 26.8 pg (ref 26.0–34.0)
MCHC: 29.2 g/dL — ABNORMAL LOW (ref 30.0–36.0)
MCV: 91.8 fL (ref 80.0–100.0)
Platelets: 406 10*3/uL — ABNORMAL HIGH (ref 150–400)
RBC: 4.25 MIL/uL (ref 4.22–5.81)
RDW: 16.3 % — ABNORMAL HIGH (ref 11.5–15.5)
WBC: 10.5 10*3/uL (ref 4.0–10.5)
nRBC: 0.2 % (ref 0.0–0.2)

## 2020-01-25 LAB — MAGNESIUM: Magnesium: 2.4 mg/dL (ref 1.7–2.4)

## 2020-01-25 LAB — BASIC METABOLIC PANEL
Anion gap: 6 (ref 5–15)
BUN: 36 mg/dL — ABNORMAL HIGH (ref 8–23)
CO2: 36 mmol/L — ABNORMAL HIGH (ref 22–32)
Calcium: 9 mg/dL (ref 8.9–10.3)
Chloride: 104 mmol/L (ref 98–111)
Creatinine, Ser: 0.66 mg/dL (ref 0.61–1.24)
GFR calc Af Amer: >60 mL/min (ref >60)
GFR calc non Af Amer: >60 mL/min (ref >60)
Glucose, Bld: 197 mg/dL — ABNORMAL HIGH (ref 70–99)
Potassium: 4.8 mmol/L (ref 3.5–5.1)
Sodium: 146 mmol/L — ABNORMAL HIGH (ref 135–145)

## 2020-01-25 NOTE — Progress Notes (Signed)
PROGRESS NOTE    Sean Porter  ZOX:[id number] DOB: 02/07/1951 DOA: 12/19/2019    Brief Narrative:  Sean FolksEddie Waltonis an 69 y.o.malewith history of congestive heart failure, diabetes mellitus, hypertension, tobacco abuse who [facility] with abdominal pain. Was noted to be hypotensive with free air noted on the abdominal imaging. He had emergent exploratory laparotomy where he was found to have gangrenous cholecystitis with pancreatitis and infected pancreatic bed and necrosis. He had the gallbladder removed and the necrotic pancreas debrided. Initially he was in septic shock required pressors for shock and remained ventilated postoperatively. On postoperative day 1 he developed atrial flutter and required amiodarone. He was weaned from pressors on day 5 but intermittently required pressors after that. He was treated with antibiotic therapy with meropenem and vancomycin for 5 days. His blood cultures were negative but his wound showed Enterococcus faecalis and he was switched to Zosyn. Previously he had 2 JP drains and an Abrahamson drain withwound VAC.He currently has midline abdominal dressing with drain in place. He underwent PEG tube and trach placement on 12/04/2019 for prolonged respiratory failure and need for nutrition. Due to his complex medical problems he was transferred to [facility] for further management. He was weanedfrom his TPN. Tentative plan was to treat him with total4 weeks of Zosyn. He was noted to have increased output from the drain site.  CT of the abdomen and pelvis done on 01/13/2020 per report thick-walled inflammatory collections in the right lower retroperitoneum and central mesentery/posterior retroperitoneum mildly increased in size.  Marked left colonic diverticulosis.  He also had fever.  He had sputum cultures collected on 01/17/2020 that showed moderate Stenotrophomonas maltophilia.  Urine cultures  reportedly showed multiple species.  His abdominal fluid cultures from 01/18/2020 showing Enterococcus faecalis, Klebsiella pneumonia, E. coli, Bacteriodes fragilis. He has been on IV meropenem, Flagyl.  CT abdomen pelvis without contrast done on 01/20/2020 per report showing right lower lobe 2.8 x 1.7 cm rounded density, concerning for possible neoplasm?  7.5 x 3.5 cm gas and fluid collection is noted in the region of the pancreatic head and body, 5. 3.4 x 3.0 cm air-fluid collection is noted anteriorly in the epigastric region which potentially may have fistulous connection with transverse colon. 8.4 x 3.7 cm fluid collection is seen in the right lower quadrant retroperitoneal region which is enlarged compared to prior exam, concerning for abscess. Also noted is 11.0 x 4.9 fluid collection in the central portion of the abdomen which may communicate with the right lower quadrant fluid collection, and is also concerning for abscess. It is slightly enlarged compared to prior exam.   Assessment & Plan:   Active Problems:   Acute on chronic respiratory failure with hypoxia (HCC)   Congestive heart failure due to cardiomyopathy (HCC)   Chronic atrial flutter (HCC)   Severe sepsis with acute organ dysfunction due to Enterococcus species (HCC)   Acute renal failure due to tubular necrosis (HCC) Gangrenous cholecystitis with pancreatic necrosis Status post exploratory laparotomy with postoperative abdominal wound Multiple abdominal abscesses Lung mass Protein calorie malnutrition Dysphagia Diabetes mellitus type 2  Acute on chronic respiratory failure with hypoxemia: Patient with recent exploratory laparotomy and surgery. Postoperatively he had respiratory failure. He is status post decannulation.  He has been treated with Zosyn for necrotic pancreatitis secondary to ruptured gangrenous gallbladder.  He also was treated with Bactrim. He had fever.  His respiratory cultures from 01/17/2020 per report  Stenotrophomonas maltophilia, treated with Levaquin.  His respiratory currently appears to be stable.  At this time he is on meropenem, Flagyl for the intra-abdominal abscesses.  Based on the culture results and sensitivities will switch to cefepime.  Continue Flagyl.  Severe sepsis: Patient previously had septic shock at the outside facility secondary to ruptured gallbladder from gangrenous cholecystitis and pancreatic necrosis.   Received treatment with IV Zosyn.  He had fever therefore repeat pancultures ordered.  Respirate culture showing moderate Stenotrophomonas maltophilia.  Abdominal drain fluid cultures showed Enterococcus faecalis, Klebsiella pneumonia, E. coli, Bacteriodes fragilis. He has been on treatment with IV meropenem, Flagyl.  Based on the susceptibilities will change to cefepime.  Continue treatment with Flagyl.  Gangrenous cholecystitis with pancreatic necrosis/multiple abdominal abscesses: Status post expiratory laparotomy. He has a postoperative abdominal wound with dressing in place.  Continue local wound care.  He has been treated with IV Zosyn.  However, he had fevers with increased drainage therefore switched to meropenem, Flagyl.  He had a CT scan done on 01/20/2020 which per report continues to show multiple fluid collections/abscesses some of which per report have enlarged.  Interventional radiology was consulted by the primary team but no procedures at this time per IR. -Based on the susceptibilities will discontinue the meropenem and switch to cefepime.  Continue Flagyl.  We may need to consider repeating another CT in 7-10 days to evaluate for improvement in the abscesses.  Right lower lobe lung mass: Per CT report 2.8 x 1.7 cm rounded density is noted in right lower lobe with pleural tail concerning for possible neoplasm.  He may need biopsy?  Further management per pulmonary and the primary team.  Dysphagia: Due to his dysphagia he is high risk for aspiration and  worsening respiratory failure secondary to aspiration pneumonia.  Diabetes mellitus type 2: Continue to monitor Accu-Cheks, further management of diabetes per the primary team.  He will need proper glycemic control in order to enable wound healing.  Protein calorie malnutrition: On tube feeds. Further management per primary team.  Congestive heart failure: At this time appears to be compensated. Continue medications and management per primary team. However, he is very high risk for decompensated CHF. Monitor closely.  Chronic atrial flutter: Continue medications per primary team. Currently rate controlled.  Acute renal failure: Patient at the outside facility also had acute renal failure. He is high risk for worsening renal failure.  Creatinine today elevated.  On IV hydration per primary team.Continue to monitor BUN/creatinine closely especially while on antibiotics and adjust dose accordingly. Avoid nephrotoxic medication.  Due to his complex medical problems he is high risk for worsening and decompensation. Plan of care discussed with the primary team.  Subjective: Having some fevers.  Objective: Vitals: Temperature 98.4, pulse 75, respiratory rate 18, blood pressure 132/68  Examination: General exam: Ill-appearing male, awake, not in any acute distress at this time Respiratory system: Occasional rhonchi, no wheezing Cardiovascular system: S1 & S2 heard, no murmur.   Gastrointestinal system:  Distended, has dressing in place, nontender, positive bowel sounds, PEG tube in place Central nervous system: He has debility with generalized weakness  Extremities: No edema Skin: No rashes Psychiatry: stable   Data Reviewed: I have personally reviewed following labs and imaging studies  CBC: Recent Labs  Lab 01/20/20 0443 01/22/20 0612 01/25/20 0512  WBC 9.5 9.1 10.5  HGB 10.7* 11.2* 11.4*  HCT 36.3* 40.3 39.0  MCV 91.7 94.4 91.8  PLT 337 350 406*   Basic  Metabolic Panel: Recent Labs  Lab  01/20/20 0443 01/22/20 0612 01/23/20 0452 01/25/20 0512  NA 143 146* 145 146*  K 4.6 5.1 4.6 4.8  CL 99 101 102 104  CO2 32 35* 36* 36*  GLUCOSE 197* 235* 218* 197*  BUN 48* 41* 45* 36*  CREATININE 1.00 0.83 0.97 0.66  CALCIUM 9.3 9.3 8.8* 9.0  MG 2.6* 2.6*  --  2.4  PHOS 4.3 3.5  --   --    GFR: CrCl cannot be calculated (Unknown ideal weight.). Liver Function Tests: No results for input(s): AST, ALT, ALKPHOS, BILITOT, PROT, ALBUMIN in the last 168 hours. No results for input(s): LIPASE, AMYLASE in the last 168 hours. No results for input(s): AMMONIA in the last 168 hours. Coagulation Profile: No results for input(s): INR, PROTIME in the last 168 hours. Cardiac Enzymes: No results for input(s): CKTOTAL, CKMB, CKMBINDEX, TROPONINI in the last 168 hours. BNP (last 3 results) No results for input(s): PROBNP in the last 8760 hours. HbA1C: No results for input(s): HGBA1C in the last 72 hours. CBG: No results for input(s): GLUCAP in the last 168 hours. Lipid Profile: No results for input(s): CHOL, HDL, LDLCALC, TRIG, CHOLHDL, LDLDIRECT in the last 72 hours. Thyroid Function Tests: No results for input(s): TSH, T4TOTAL, FREET4, T3FREE, THYROIDAB in the last 72 hours. Anemia Panel: No results for input(s): VITAMINB12, FOLATE, FERRITIN, TIBC, IRON, RETICCTPCT in the last 72 hours. Sepsis Labs: No results for input(s): PROCALCITON, LATICACIDVEN in the last 168 hours.  Recent Results (from the past 240 hour(s))  Expectorated sputum assessment w rflx to resp cult     Status: None   Collection Time: 01/17/20 11:35 AM   Specimen: Expectorated Sputum  Result Value Ref Range Status   Specimen Description EXPECTORATED SPUTUM  Final   Special Requests NONE  Final   Sputum evaluation   Final    THIS SPECIMEN IS ACCEPTABLE FOR SPUTUM CULTURE Performed at [facility], [address] [place], [place] [address]    Report Status 01/17/2020  FINAL  Final  Culture, respiratory     Status: None   Collection Time: 01/17/20 11:35 AM  Result Value Ref Range Status   Specimen Description EXPECTORATED SPUTUM  Final   Special Requests NONE Reflexed from [id number]  Final   Gram Stain   Final    FEW WBC PRESENT,BOTH PMN AND MONONUCLEAR FEW GRAM POSITIVE COCCI IN PAIRS IN CLUSTERS Performed at [facility], [address], [place], [place] [address]    Culture MODERATE STENOTROPHOMONAS MALTOPHILIA  Final   Report Status 01/21/2020 FINAL  Final   Organism ID, Bacteria STENOTROPHOMONAS MALTOPHILIA  Final      Susceptibility   Stenotrophomonas maltophilia - MIC*    LEVOFLOXACIN <=0.12 SENSITIVE Sensitive     TRIMETH/SULFA <=20 SENSITIVE Sensitive     * MODERATE STENOTROPHOMONAS MALTOPHILIA  Culture, Urine     Status: Abnormal   Collection Time: 01/17/20  5:45 PM   Specimen: Urine, Random  Result Value Ref Range Status   Specimen Description URINE, RANDOM  Final   Special Requests   Final    NONE Performed at [facility], [address], [place], [place] [address]    Culture MULTIPLE SPECIES PRESENT, SUGGEST RECOLLECTION (A)  Final   Report Status 01/19/2020 FINAL  Final  Aerobic/Anaerobic Culture (surgical/deep wound)     Status: None   Collection Time: 01/18/20  9:51 PM   Specimen: Abdomen  Result Value Ref Range Status   Specimen Description ABDOMEN  Final   Special Requests DRAIN  Final   Gram Stain   Final    NO WBC SEEN ABUNDANT GRAM NEGATIVE RODS MODERATE GRAM POSITIVE RODS MODERATE GRAM POSITIVE COCCI IN PAIRS IN CLUSTERS    Culture   Final    ABUNDANT ENTEROCOCCUS FAECALIS MODERATE KLEBSIELLA PNEUMONIAE MODERATE ESCHERICHIA COLI MODERATE BACTEROIDES FRAGILIS MODERATE BACTEROIDES UNIFORMIS BETA LACTAMASE POSITIVE Performed at [facility], [address], [place], [place] [address]    Report Status 01/21/2020 FINAL  Final   Organism ID, Bacteria ENTEROCOCCUS FAECALIS  Final   Organism ID,  Bacteria KLEBSIELLA PNEUMONIAE  Final   Organism ID, Bacteria ESCHERICHIA COLI  Final      Susceptibility   Escherichia coli - MIC*    AMPICILLIN 4 SENSITIVE Sensitive     CEFAZOLIN <=4 SENSITIVE Sensitive     CEFEPIME <=0.12 SENSITIVE Sensitive     CEFTAZIDIME <=1 SENSITIVE Sensitive     CEFTRIAXONE <=0.25 SENSITIVE Sensitive     CIPROFLOXACIN <=0.25 SENSITIVE Sensitive     GENTAMICIN <=1 SENSITIVE Sensitive     IMIPENEM <=0.25 SENSITIVE Sensitive     TRIMETH/SULFA <=20 SENSITIVE Sensitive     AMPICILLIN/SULBACTAM <=2 SENSITIVE Sensitive     PIP/TAZO <=4 SENSITIVE Sensitive     * MODERATE ESCHERICHIA COLI   Enterococcus faecalis - MIC*    AMPICILLIN <=2 SENSITIVE Sensitive     VANCOMYCIN 1 SENSITIVE Sensitive     GENTAMICIN SYNERGY SENSITIVE Sensitive     * ABUNDANT ENTEROCOCCUS FAECALIS   Klebsiella pneumoniae - MIC*    AMPICILLIN >=32 RESISTANT Resistant     CEFAZOLIN <=4 SENSITIVE Sensitive     CEFEPIME <=0.12 SENSITIVE Sensitive     CEFTAZIDIME <=1 SENSITIVE Sensitive     CEFTRIAXONE <=0.25 SENSITIVE Sensitive     CIPROFLOXACIN <=0.25 SENSITIVE Sensitive     GENTAMICIN <=1 SENSITIVE Sensitive     IMIPENEM 0.5 SENSITIVE Sensitive     TRIMETH/SULFA <=20 SENSITIVE Sensitive     AMPICILLIN/SULBACTAM 16 INTERMEDIATE Intermediate     PIP/TAZO 8 SENSITIVE Sensitive     * MODERATE KLEBSIELLA PNEUMONIAE  Culture, Urine     Status: None   Collection Time: 01/20/20 11:13 AM   Specimen: Urine, Random  Result Value Ref Range Status   Specimen Description URINE, RANDOM  Final   Special Requests   Final    NONE Performed at [facility], [address] [place], [place] [address]    Culture NO GROWTH  Final   Report Status 01/21/2020 FINAL  Final         Radiology Studies: CT abdomen/pelvis without contrast: 1. 2.8 x 1.7 cm rounded density is noted in right lower lobe with pleural tail concerning for possible neoplasm. While not visualized on prior exam, it may not  have been included in field-of-view on prior exam. CT scan of the chest is recommended for further evaluation. 2. 7.5 x 3.5 cm gas and fluid collection is noted in the region of the pancreatic head and body which is not significantly changed compared to prior exam. 3. 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 4. Inflammatory changes are seen involving the hepatic flexure and proximal transverse colon concerning for colitis. 5. 3.4 x 3.0 cm air-fluid collection is noted anteriorly in the epigastric region which potentially may have fistulous connection with transverse colon. 6. 8.4 x 3.7 cm fluid collection is seen in the right lower quadrant retroperitoneal region  which is enlarged compared to prior exam. This is also concerning for abscess. Also noted is 11.0 x 4.9 fluid collection in the central portion of the abdomen which may communicate with the right lower quadrant fluid collection, and is also concerning for abscess. It is slightly enlarged compared to prior exam.   Scheduled Meds: Please see MAR    Vonzella Nipple, MD  01/25/2020, 4:27 PM

## 2020-01-26 LAB — BASIC METABOLIC PANEL
Anion gap: 7 (ref 5–15)
BUN: 37 mg/dL — ABNORMAL HIGH (ref 8–23)
CO2: 34 mmol/L — ABNORMAL HIGH (ref 22–32)
Calcium: 9 mg/dL (ref 8.9–10.3)
Chloride: 101 mmol/L (ref 98–111)
Creatinine, Ser: 0.8 mg/dL (ref 0.61–1.24)
GFR calc Af Amer: >60 mL/min (ref >60)
GFR calc non Af Amer: >60 mL/min (ref >60)
Glucose, Bld: 201 mg/dL — ABNORMAL HIGH (ref 70–99)
Potassium: 4.7 mmol/L (ref 3.5–5.1)
Sodium: 142 mmol/L (ref 135–145)

## 2020-01-29 ENCOUNTER — Other Ambulatory Visit (HOSPITAL_COMMUNITY): Payer: Medicare HMO

## 2020-01-29 LAB — CBC
HCT: 37.8 % — ABNORMAL LOW (ref 39.0–52.0)
Hemoglobin: 10.7 g/dL — ABNORMAL LOW (ref 13.0–17.0)
MCH: 26.3 pg (ref 26.0–34.0)
MCHC: 28.3 g/dL — ABNORMAL LOW (ref 30.0–36.0)
MCV: 92.9 fL (ref 80.0–100.0)
Platelets: 314 10*3/uL (ref 150–400)
RBC: 4.07 MIL/uL — ABNORMAL LOW (ref 4.22–5.81)
RDW: 16.7 % — ABNORMAL HIGH (ref 11.5–15.5)
WBC: 13.7 10*3/uL — ABNORMAL HIGH (ref 4.0–10.5)
nRBC: 0 % (ref 0.0–0.2)

## 2020-01-29 LAB — BASIC METABOLIC PANEL
Anion gap: 9 (ref 5–15)
BUN: 30 mg/dL — ABNORMAL HIGH (ref 8–23)
CO2: 33 mmol/L — ABNORMAL HIGH (ref 22–32)
Calcium: 8.6 mg/dL — ABNORMAL LOW (ref 8.9–10.3)
Chloride: 100 mmol/L (ref 98–111)
Creatinine, Ser: 0.72 mg/dL (ref 0.61–1.24)
GFR calc Af Amer: >60 mL/min (ref >60)
GFR calc non Af Amer: >60 mL/min (ref >60)
Glucose, Bld: 160 mg/dL — ABNORMAL HIGH (ref 70–99)
Potassium: 4.6 mmol/L (ref 3.5–5.1)
Sodium: 142 mmol/L (ref 135–145)

## 2020-01-29 LAB — MAGNESIUM: Magnesium: 2.2 mg/dL (ref 1.7–2.4)

## 2020-01-31 LAB — MAGNESIUM: Magnesium: 2.2 mg/dL (ref 1.7–2.4)

## 2020-01-31 LAB — BASIC METABOLIC PANEL
Anion gap: 8 (ref 5–15)
BUN: 29 mg/dL — ABNORMAL HIGH (ref 8–23)
CO2: 34 mmol/L — ABNORMAL HIGH (ref 22–32)
Calcium: 9.1 mg/dL (ref 8.9–10.3)
Chloride: 100 mmol/L (ref 98–111)
Creatinine, Ser: 0.64 mg/dL (ref 0.61–1.24)
GFR calc Af Amer: >60 mL/min (ref >60)
GFR calc non Af Amer: >60 mL/min (ref >60)
Glucose, Bld: 142 mg/dL — ABNORMAL HIGH (ref 70–99)
Potassium: 4.8 mmol/L (ref 3.5–5.1)
Sodium: 142 mmol/L (ref 135–145)

## 2020-01-31 LAB — CBC
HCT: 36.4 % — ABNORMAL LOW (ref 39.0–52.0)
Hemoglobin: 10.3 g/dL — ABNORMAL LOW (ref 13.0–17.0)
MCH: 26.1 pg (ref 26.0–34.0)
MCHC: 28.3 g/dL — ABNORMAL LOW (ref 30.0–36.0)
MCV: 92.4 fL (ref 80.0–100.0)
Platelets: 401 10*3/uL — ABNORMAL HIGH (ref 150–400)
RBC: 3.94 MIL/uL — ABNORMAL LOW (ref 4.22–5.81)
RDW: 17 % — ABNORMAL HIGH (ref 11.5–15.5)
WBC: 10.2 10*3/uL (ref 4.0–10.5)
nRBC: 0 % (ref 0.0–0.2)

## 2020-01-31 LAB — PHOSPHORUS: Phosphorus: 3.2 mg/dL (ref 2.5–4.6)

## 2020-02-02 ENCOUNTER — Other Ambulatory Visit (HOSPITAL_COMMUNITY): Payer: Medicare HMO

## 2020-02-03 ENCOUNTER — Other Ambulatory Visit (HOSPITAL_COMMUNITY): Payer: Medicare HMO

## 2020-02-03 LAB — BASIC METABOLIC PANEL
Anion gap: 7 (ref 5–15)
BUN: 30 mg/dL — ABNORMAL HIGH (ref 8–23)
CO2: 33 mmol/L — ABNORMAL HIGH (ref 22–32)
Calcium: 8.8 mg/dL — ABNORMAL LOW (ref 8.9–10.3)
Chloride: 101 mmol/L (ref 98–111)
Creatinine, Ser: 0.78 mg/dL (ref 0.61–1.24)
GFR calc Af Amer: >60 mL/min (ref >60)
GFR calc non Af Amer: >60 mL/min (ref >60)
Glucose, Bld: 158 mg/dL — ABNORMAL HIGH (ref 70–99)
Potassium: 4.7 mmol/L (ref 3.5–5.1)
Sodium: 141 mmol/L (ref 135–145)

## 2020-02-03 LAB — CBC
HCT: 36.2 % — ABNORMAL LOW (ref 39.0–52.0)
Hemoglobin: 10.4 g/dL — ABNORMAL LOW (ref 13.0–17.0)
MCH: 26.4 pg (ref 26.0–34.0)
MCHC: 28.7 g/dL — ABNORMAL LOW (ref 30.0–36.0)
MCV: 91.9 fL (ref 80.0–100.0)
Platelets: 423 10*3/uL — ABNORMAL HIGH (ref 150–400)
RBC: 3.94 MIL/uL — ABNORMAL LOW (ref 4.22–5.81)
RDW: 17.4 % — ABNORMAL HIGH (ref 11.5–15.5)
WBC: 9 10*3/uL (ref 4.0–10.5)
nRBC: 0 % (ref 0.0–0.2)

## 2020-02-03 LAB — PHOSPHORUS: Phosphorus: 3.5 mg/dL (ref 2.5–4.6)

## 2020-02-03 LAB — MAGNESIUM: Magnesium: 2.2 mg/dL (ref 1.7–2.4)

## 2020-02-05 ENCOUNTER — Other Ambulatory Visit (HOSPITAL_COMMUNITY): Payer: Medicare HMO

## 2020-02-05 LAB — CBC
HCT: 37.1 % — ABNORMAL LOW (ref 39.0–52.0)
Hemoglobin: 10.5 g/dL — ABNORMAL LOW (ref 13.0–17.0)
MCH: 26.1 pg (ref 26.0–34.0)
MCHC: 28.3 g/dL — ABNORMAL LOW (ref 30.0–36.0)
MCV: 92.3 fL (ref 80.0–100.0)
Platelets: 386 10*3/uL (ref 150–400)
RBC: 4.02 MIL/uL — ABNORMAL LOW (ref 4.22–5.81)
RDW: 17.6 % — ABNORMAL HIGH (ref 11.5–15.5)
WBC: 9 10*3/uL (ref 4.0–10.5)
nRBC: 0 % (ref 0.0–0.2)

## 2020-02-05 LAB — BASIC METABOLIC PANEL
Anion gap: 7 (ref 5–15)
BUN: 31 mg/dL — ABNORMAL HIGH (ref 8–23)
CO2: 32 mmol/L (ref 22–32)
Calcium: 8.9 mg/dL (ref 8.9–10.3)
Chloride: 103 mmol/L (ref 98–111)
Creatinine, Ser: 0.77 mg/dL (ref 0.61–1.24)
GFR calc Af Amer: >60 mL/min (ref >60)
GFR calc non Af Amer: >60 mL/min (ref >60)
Glucose, Bld: 153 mg/dL — ABNORMAL HIGH (ref 70–99)
Potassium: 4.9 mmol/L (ref 3.5–5.1)
Sodium: 142 mmol/L (ref 135–145)

## 2020-02-05 LAB — PHOSPHORUS: Phosphorus: 3.6 mg/dL (ref 2.5–4.6)

## 2020-02-05 LAB — MAGNESIUM: Magnesium: 2.2 mg/dL (ref 1.7–2.4)

## 2020-02-06 LAB — SARS CORONAVIRUS 2 (TAT 6-24 HRS): SARS Coronavirus 2: NEGATIVE

## 2020-03-02 DEATH — deceased

## 2020-05-06 IMAGING — DX DG ABDOMEN 1V
1 series · 2 of 2 positions shown · non-contrast
Comparison: Abdominal radiograph dated 12/21/2019 and CT scan of
the abdomen dated 01/13/2020

CLINICAL DATA: Nausea and vomiting.

EXAM:
ABDOMEN - 1 VIEW

[Series 1: abdomen · 0.14mm/px · 2 of 2 slices shown]
[im 1/2]
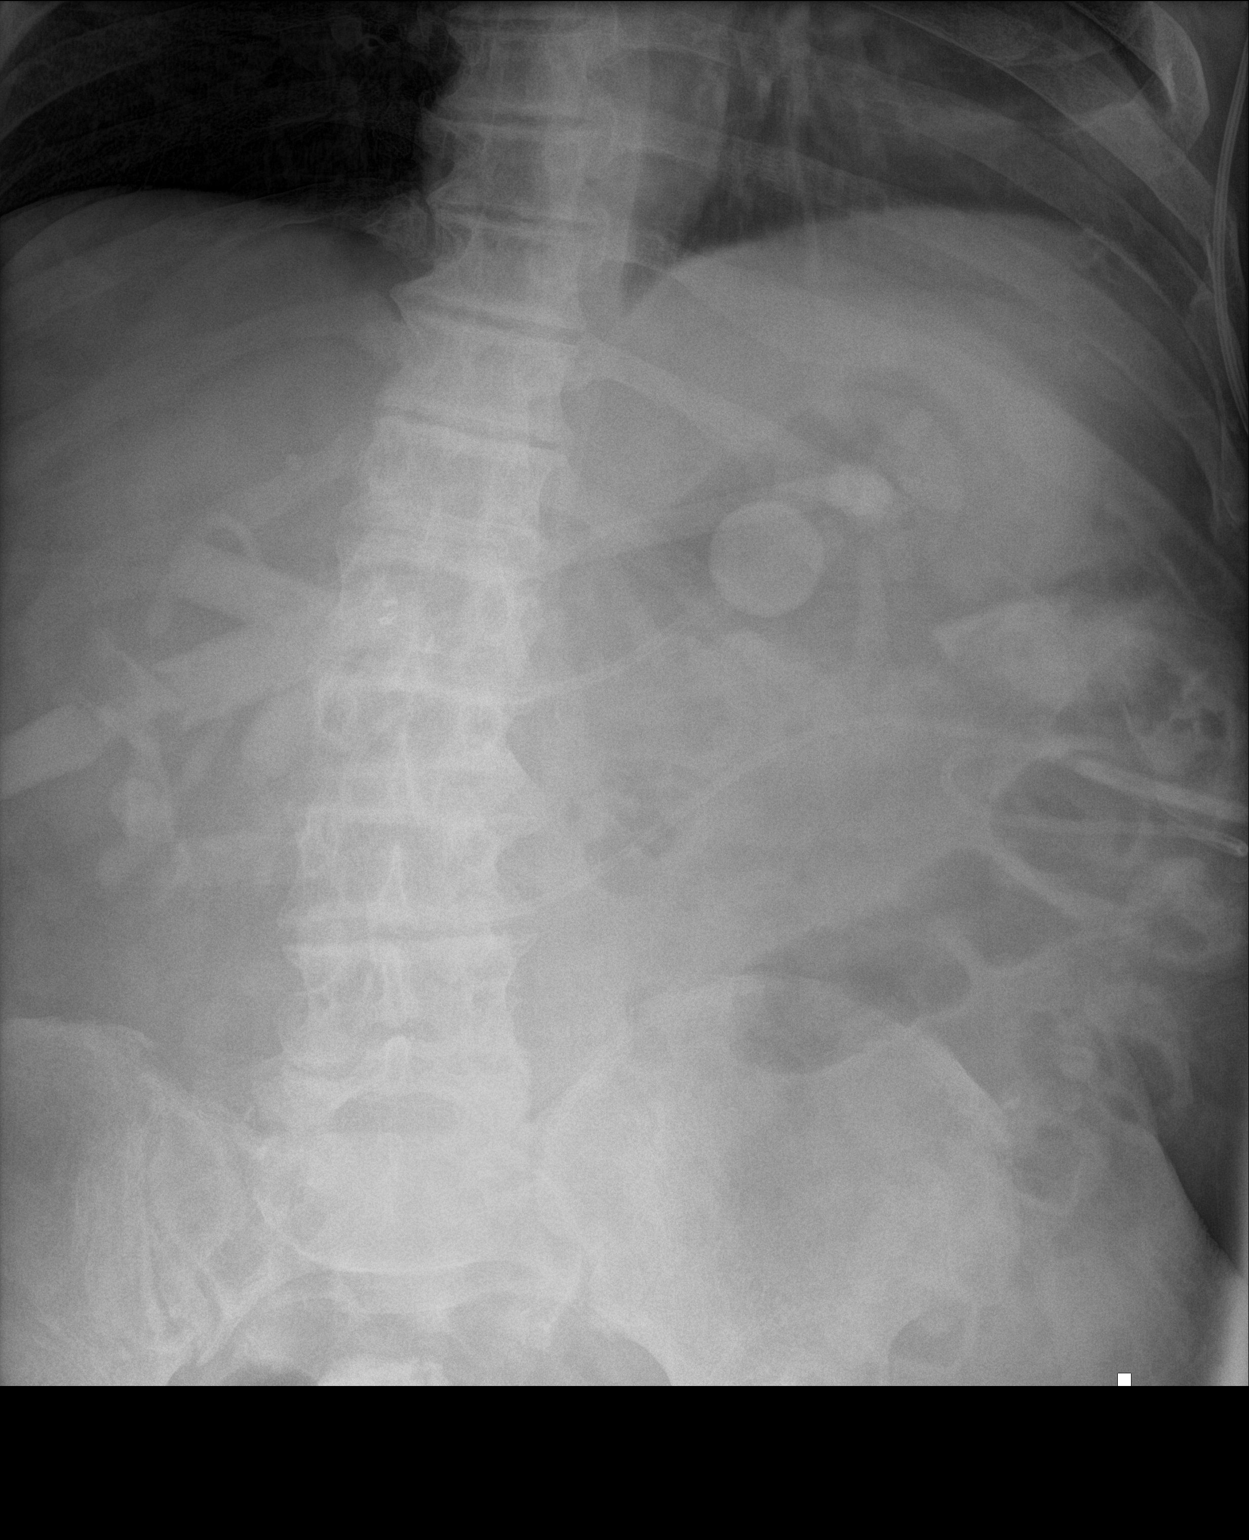
[im 2/2]
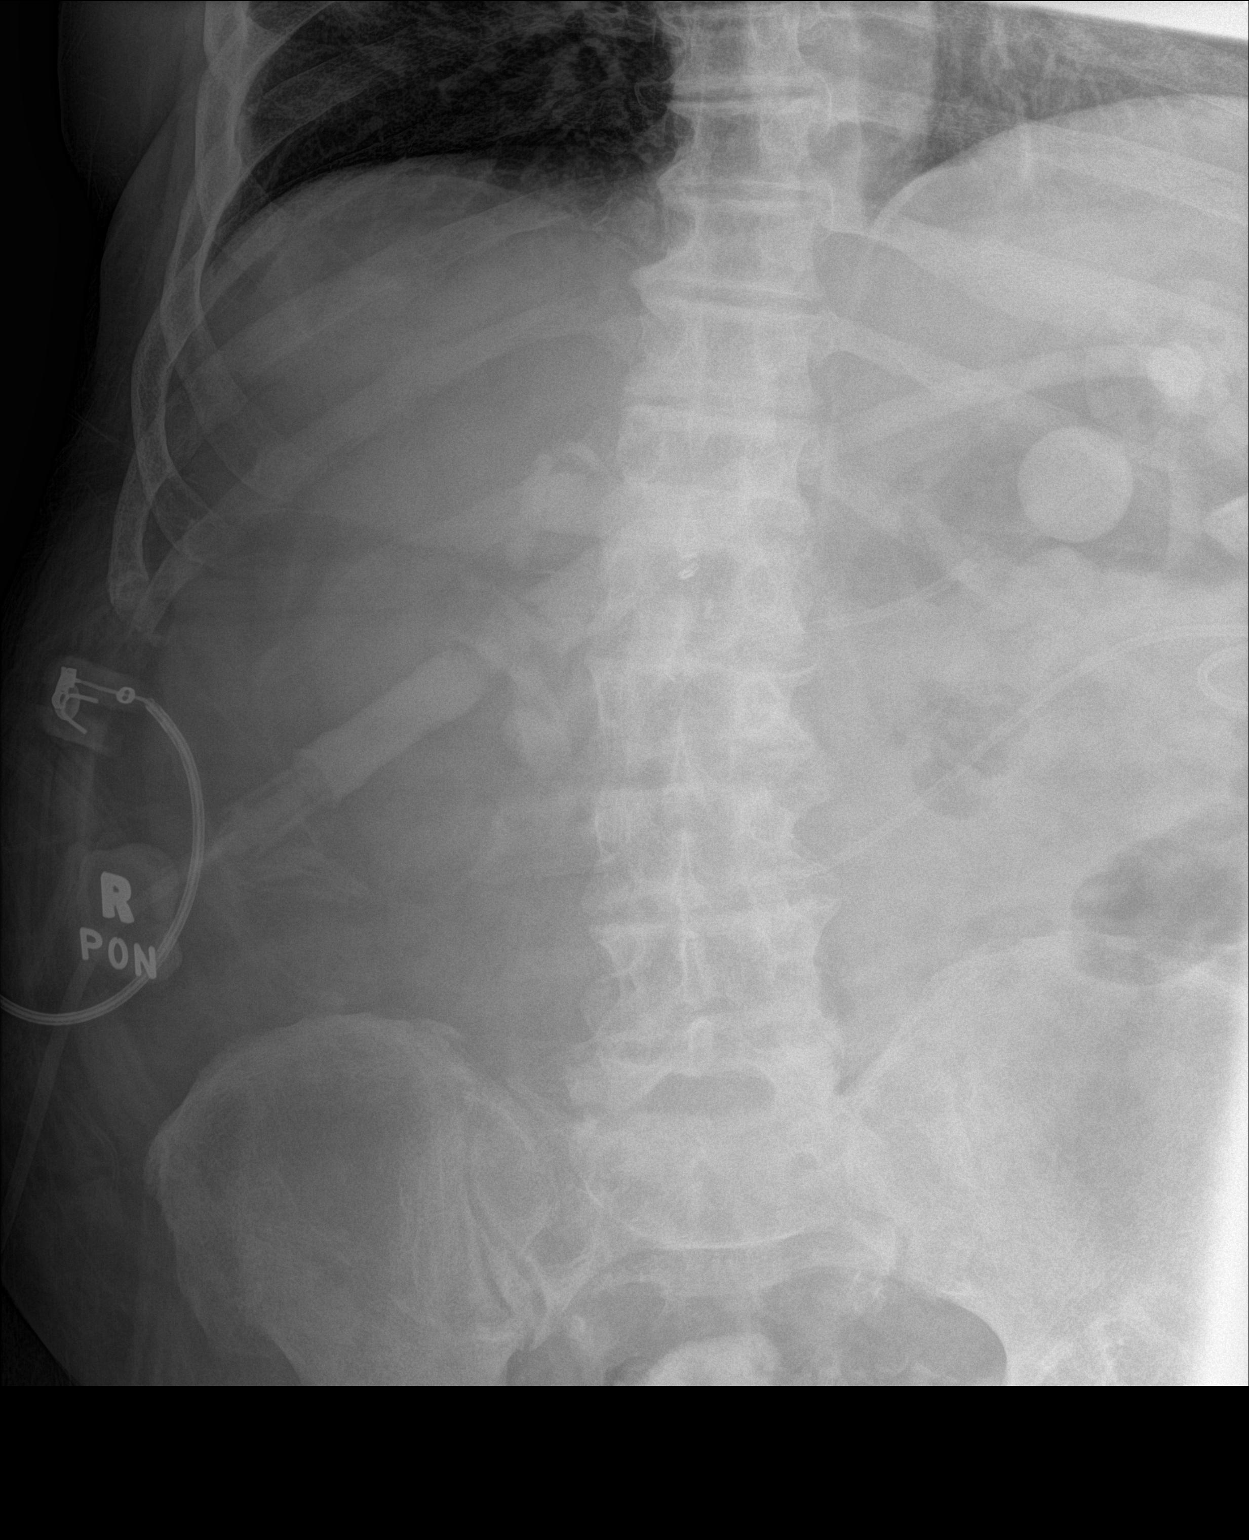

[2 of 2 positions shown; findings below may reference images not displayed]

FINDINGS: Percutaneous gastrostomy tube with a feeding tube extending into the
jejunum, unchanged.

No dilatation of the bowel. Small amount of residual contrast in the
distal colon from the prior CT scan.

No acute bone abnormality.
IMPRESSION: No appreciable acute abnormalities.
